# Patient Record
Sex: Female | Born: 1980 | Race: White | Hispanic: No | Marital: Single | Smoking: Former smoker
Health system: Southern US, Community
[De-identification: ages and names within clinical notes are randomized; demographics above are authoritative.]

## PROBLEM LIST (undated history)

## (undated) ENCOUNTER — Inpatient Hospital Stay (HOSPITAL_COMMUNITY): Payer: Self-pay

## (undated) DIAGNOSIS — O24419 Gestational diabetes mellitus in pregnancy, unspecified control: Secondary | ICD-10-CM

## (undated) DIAGNOSIS — E7800 Pure hypercholesterolemia, unspecified: Secondary | ICD-10-CM

## (undated) DIAGNOSIS — F419 Anxiety disorder, unspecified: Secondary | ICD-10-CM

## (undated) HISTORY — PX: DILATION AND CURETTAGE OF UTERUS: SHX78

## (undated) HISTORY — PX: KNEE SURGERY: SHX244

---
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
## 2001-08-04 ENCOUNTER — Emergency Department (HOSPITAL_COMMUNITY): Admission: EM | Admit: 2001-08-04 | Discharge: 2001-08-04 | Payer: Self-pay | Admitting: Emergency Medicine

## 2001-08-04 ENCOUNTER — Encounter: Payer: Self-pay | Admitting: Emergency Medicine

## 2002-06-13 ENCOUNTER — Other Ambulatory Visit: Admission: RE | Admit: 2002-06-13 | Discharge: 2002-06-13 | Payer: Self-pay | Admitting: *Deleted

## 2002-11-18 ENCOUNTER — Encounter: Admission: RE | Admit: 2002-11-18 | Discharge: 2002-11-18 | Payer: Self-pay | Admitting: *Deleted

## 2003-01-02 ENCOUNTER — Inpatient Hospital Stay (HOSPITAL_COMMUNITY): Admission: AD | Admit: 2003-01-02 | Discharge: 2003-01-05 | Payer: Self-pay | Admitting: *Deleted

## 2003-02-13 ENCOUNTER — Other Ambulatory Visit: Admission: RE | Admit: 2003-02-13 | Discharge: 2003-02-13 | Payer: Self-pay | Admitting: *Deleted

## 2004-03-24 ENCOUNTER — Other Ambulatory Visit: Admission: RE | Admit: 2004-03-24 | Discharge: 2004-03-24 | Payer: Self-pay | Admitting: *Deleted

## 2004-09-19 ENCOUNTER — Ambulatory Visit (HOSPITAL_COMMUNITY): Admission: RE | Admit: 2004-09-19 | Discharge: 2004-09-19 | Payer: Self-pay | Admitting: Obstetrics and Gynecology

## 2005-08-30 ENCOUNTER — Other Ambulatory Visit: Admission: RE | Admit: 2005-08-30 | Discharge: 2005-08-30 | Payer: Self-pay | Admitting: Obstetrics and Gynecology

## 2005-11-07 IMAGING — US US OB COMP LESS 14 WK
1 series · 18 of 28 positions shown · non-contrast
Comparison: none

CLINICAL DATA: Unsure LMP with left lower quadrant pain.  Evaluate for ectopic pregnancy.  
EARLY OBSTETRICAL ULTRASOUND WITH TRANSVAGINAL: 
Multiple images of the uterus and adnexa were obtained using a transabdominal and endovaginal approaches. 
There is an intrauterine gestational sac which demonstrates a sac size correlating with a 5 week 0 day gestation.  A normal appearing yolk sac is seen.  No evidence for a fetal pole is seen, but would not be expected at today?s mean sac diameter of .5 cm.  Follow-up evaluation in one week can be undertaken for reassessment of appropriate progression of pregnancy if desired.  
There is a small old subchorionic hemorrhage identified.  No signs of a subacute or acute component to this subchorionic hemorrhage are noted. 
Both ovaries are seen and have a normal appearance with the left ovary measuring 2.3 x 2.8 x 2.0 cm and the right ovary measuring 2.4 x 2.4 x 1.5 cm.  No cul-de-sac or periovarian fluid is seen and no separate adnexal masses are noted.

[Series 1: us ob comp<14 wk · 18 of 53 slices shown]
[im 1/53]
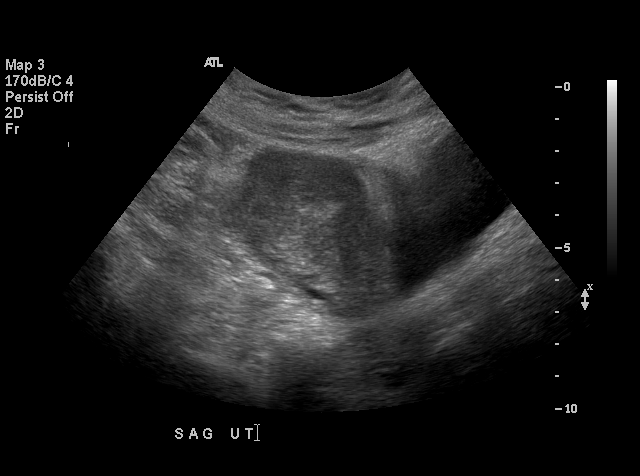
[im 4/53]
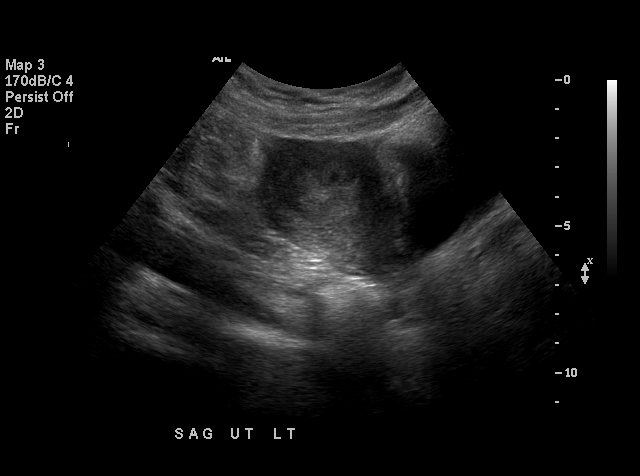
[im 6/53]
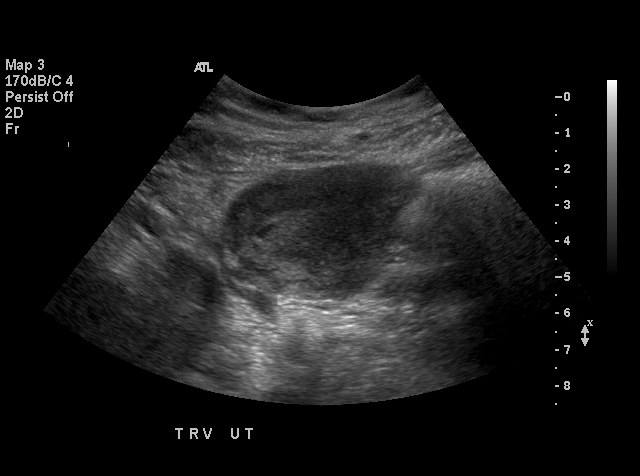
[im 10/53]
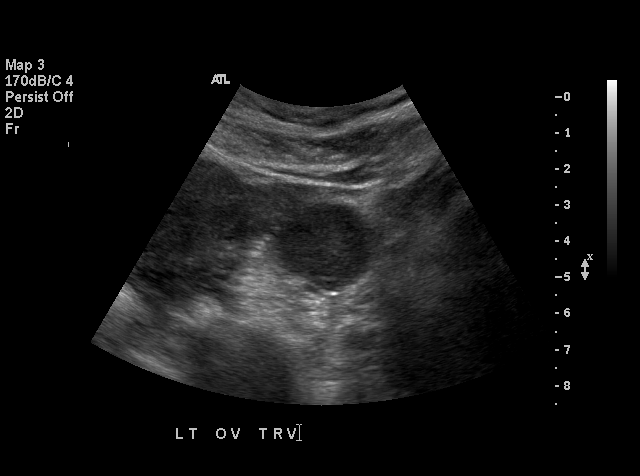
[im 14/53]
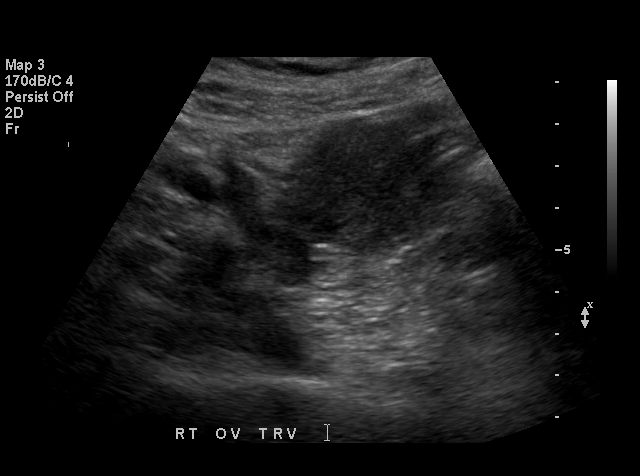
[im 16/53]
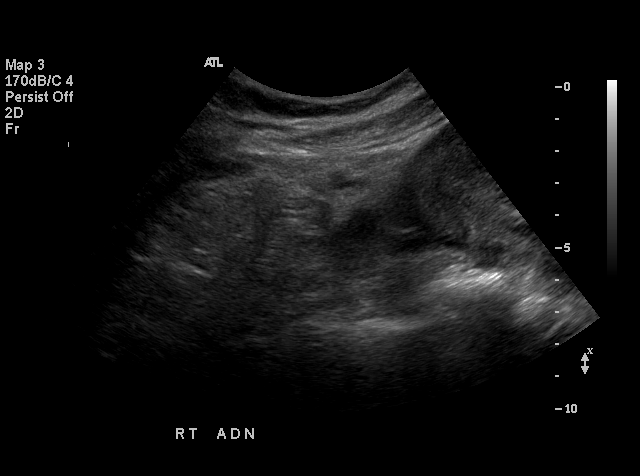
[im 20/53]
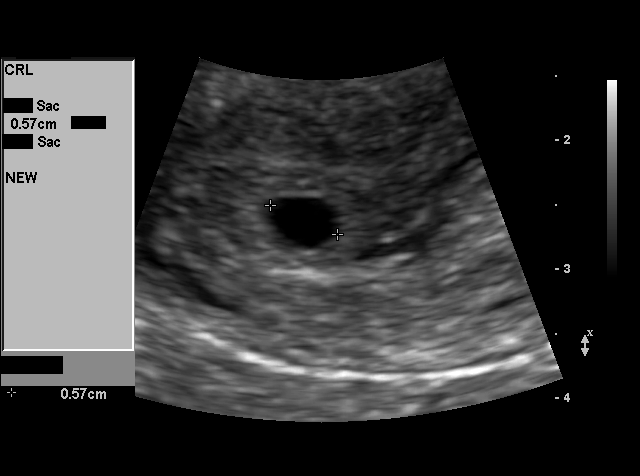
[im 22/53]
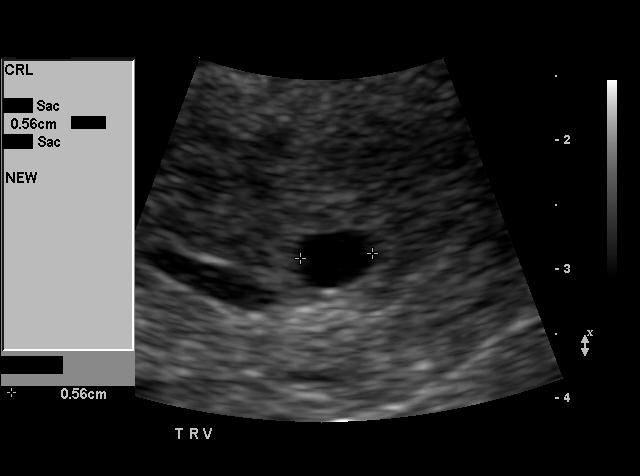
[im 26/53]
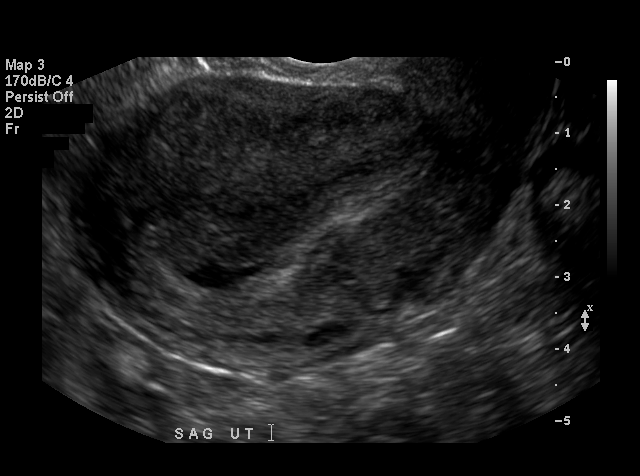
[im 27/53]
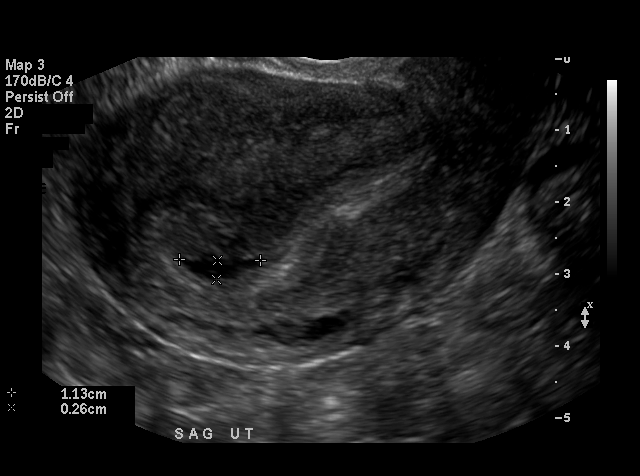
[im 31/53]
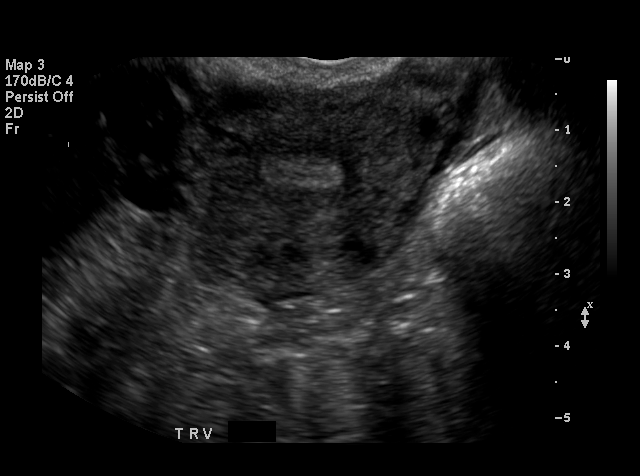
[im 33/53]
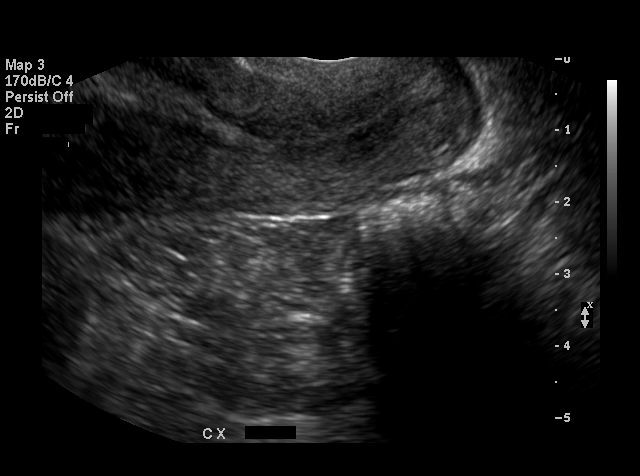
[im 37/53]
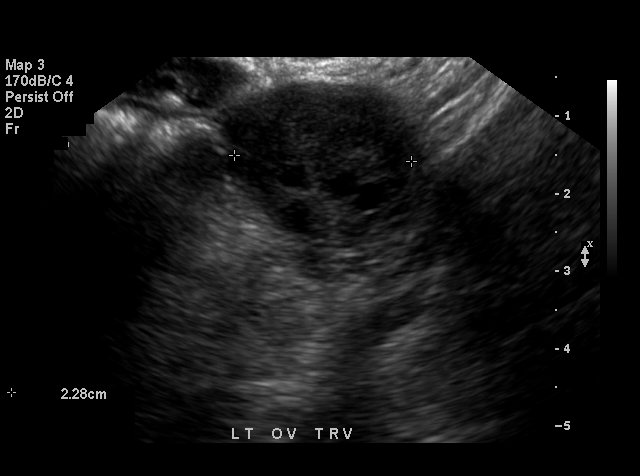
[im 41/53]
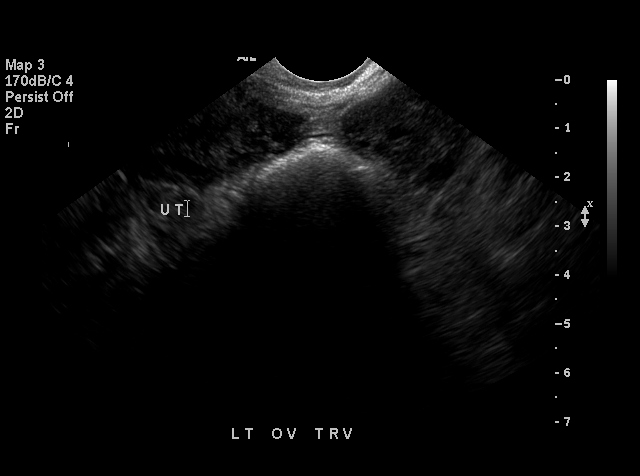
[im 43/53]
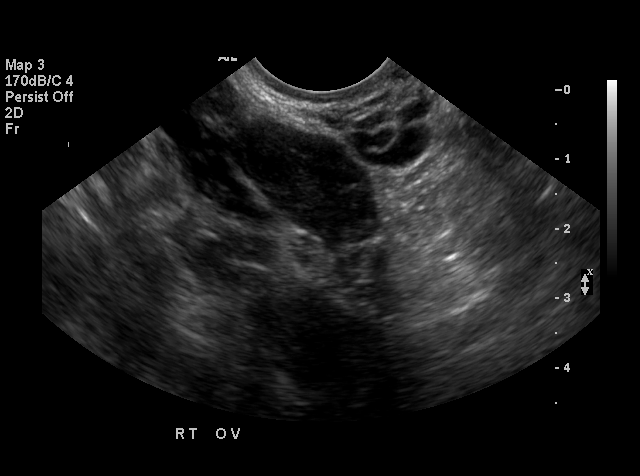
[im 47/53]
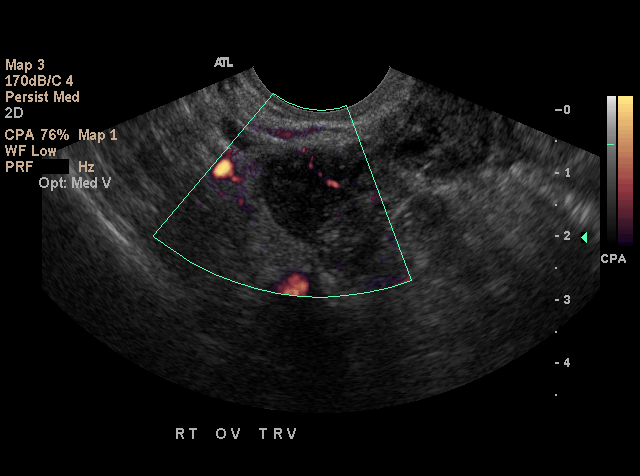
[im 49/53]
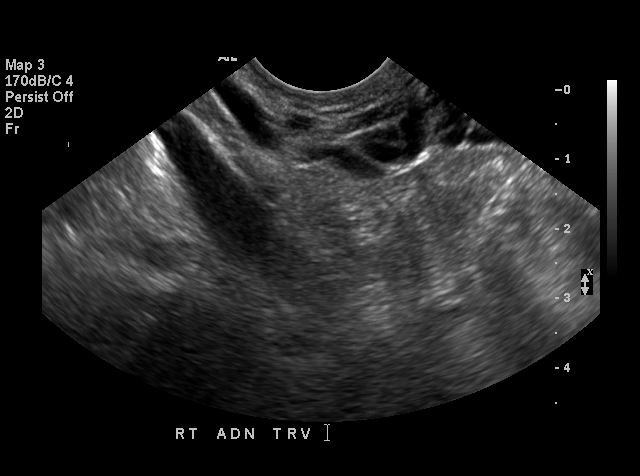
[im 53/53]
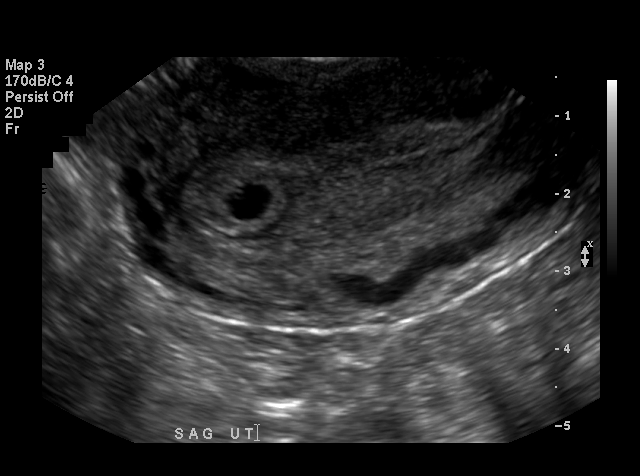

[18 of 28 positions shown; findings below may reference images not displayed]

IMPRESSION: 5 week 0 day intrauterine gestational sac with yolk sac.  
Small old subchorionic hemorrhage.  
Normal ovaries.  Please see above report for full discussion.

## 2006-01-05 ENCOUNTER — Inpatient Hospital Stay (HOSPITAL_COMMUNITY): Admission: AD | Admit: 2006-01-05 | Discharge: 2006-01-05 | Payer: Self-pay | Admitting: Obstetrics & Gynecology

## 2006-02-15 ENCOUNTER — Other Ambulatory Visit: Admission: RE | Admit: 2006-02-15 | Discharge: 2006-02-15 | Payer: Self-pay | Admitting: Obstetrics and Gynecology

## 2006-04-18 ENCOUNTER — Inpatient Hospital Stay (HOSPITAL_COMMUNITY): Admission: AD | Admit: 2006-04-18 | Discharge: 2006-04-18 | Payer: Self-pay | Admitting: Obstetrics and Gynecology

## 2006-06-26 ENCOUNTER — Encounter: Admission: RE | Admit: 2006-06-26 | Discharge: 2006-06-26 | Payer: Self-pay | Admitting: Obstetrics and Gynecology

## 2006-08-15 ENCOUNTER — Inpatient Hospital Stay (HOSPITAL_COMMUNITY): Admission: AD | Admit: 2006-08-15 | Discharge: 2006-08-15 | Payer: Self-pay | Admitting: Obstetrics and Gynecology

## 2006-08-24 ENCOUNTER — Inpatient Hospital Stay (HOSPITAL_COMMUNITY): Admission: AD | Admit: 2006-08-24 | Discharge: 2006-08-24 | Payer: Self-pay | Admitting: Obstetrics and Gynecology

## 2006-09-05 ENCOUNTER — Inpatient Hospital Stay (HOSPITAL_COMMUNITY): Admission: RE | Admit: 2006-09-05 | Discharge: 2006-09-08 | Payer: Self-pay | Admitting: Obstetrics and Gynecology

## 2007-07-21 ENCOUNTER — Emergency Department (HOSPITAL_COMMUNITY): Admission: EM | Admit: 2007-07-21 | Discharge: 2007-07-22 | Payer: Self-pay | Admitting: Emergency Medicine

## 2008-05-28 ENCOUNTER — Inpatient Hospital Stay (HOSPITAL_COMMUNITY): Admission: AD | Admit: 2008-05-28 | Discharge: 2008-05-28 | Payer: Self-pay | Admitting: Obstetrics and Gynecology

## 2008-10-31 ENCOUNTER — Inpatient Hospital Stay (HOSPITAL_COMMUNITY): Admission: AD | Admit: 2008-10-31 | Discharge: 2008-10-31 | Payer: Self-pay | Admitting: Obstetrics and Gynecology

## 2008-12-26 ENCOUNTER — Emergency Department (HOSPITAL_COMMUNITY): Admission: EM | Admit: 2008-12-26 | Discharge: 2008-12-26 | Payer: Self-pay | Admitting: Emergency Medicine

## 2009-03-19 ENCOUNTER — Encounter (INDEPENDENT_AMBULATORY_CARE_PROVIDER_SITE_OTHER): Payer: Self-pay | Admitting: Obstetrics and Gynecology

## 2009-03-19 ENCOUNTER — Ambulatory Visit (HOSPITAL_COMMUNITY): Admission: RE | Admit: 2009-03-19 | Discharge: 2009-03-19 | Payer: Self-pay | Admitting: Obstetrics and Gynecology

## 2009-05-01 ENCOUNTER — Emergency Department (HOSPITAL_COMMUNITY): Admission: EM | Admit: 2009-05-01 | Discharge: 2009-05-01 | Payer: Self-pay | Admitting: Emergency Medicine

## 2009-12-19 IMAGING — US US PELVIS COMPLETE MODIFY
1 series · 14 of 25 positions shown · non-contrast
Comparison: none

CLINICAL DATA: Left pelvic pain.

TRANSABDOMINAL AND TRANSVAGINAL ULTRASOUND OF PELVIS
TECHNIQUE: Both transabdominal and transvaginal ultrasound
examinations of the pelvis were performed including evaluation of
the uterus, ovaries, adnexal regions, and pelvic cul-de-sac.

[Series 1: us pelvis complete modify · 0.30mm/px · 14 of 37 slices shown]
[im 1/37]
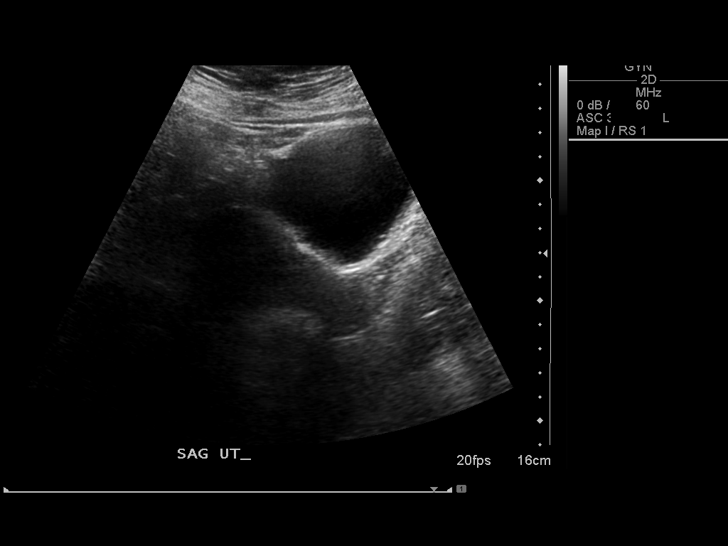
[im 4/37]
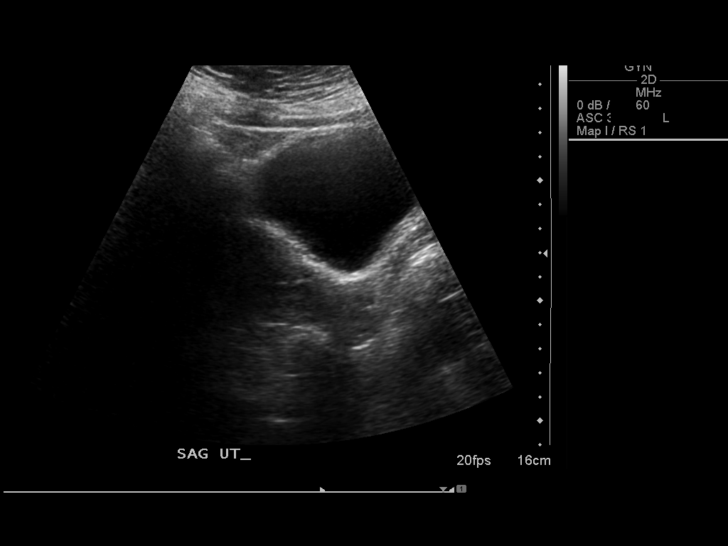
[im 7/37]
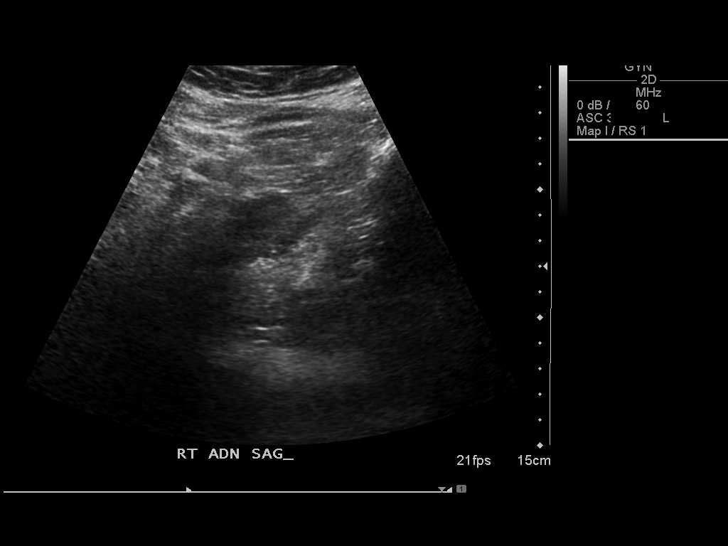
[im 10/37]
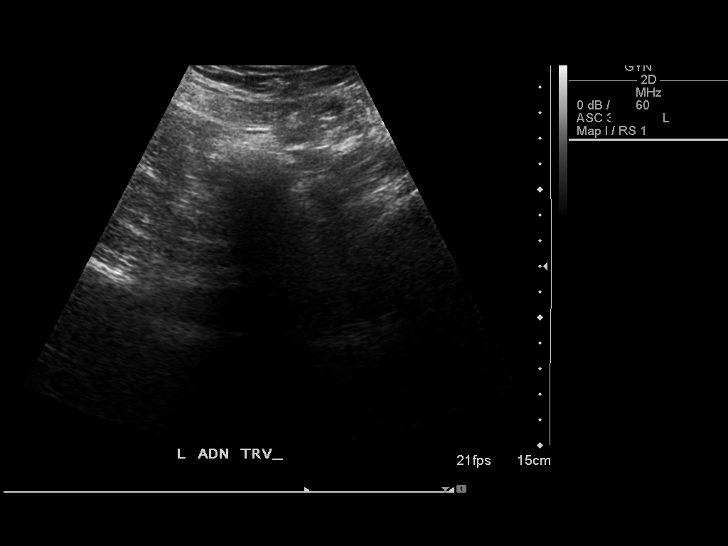
[im 13/37]
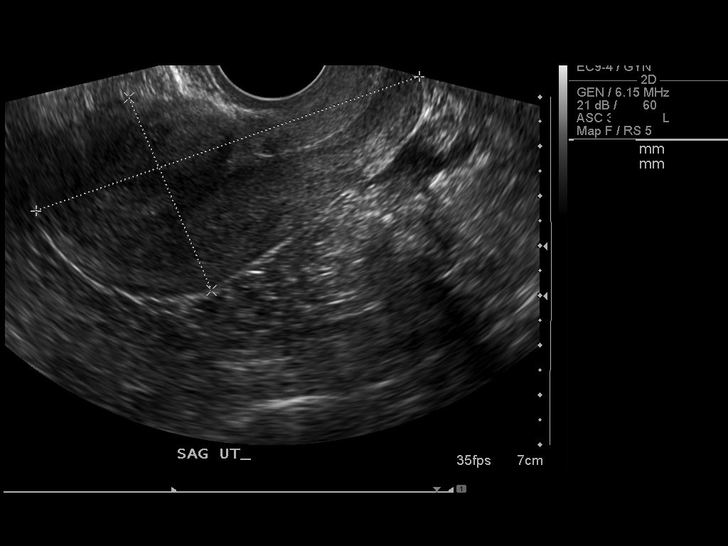
[im 14/37]
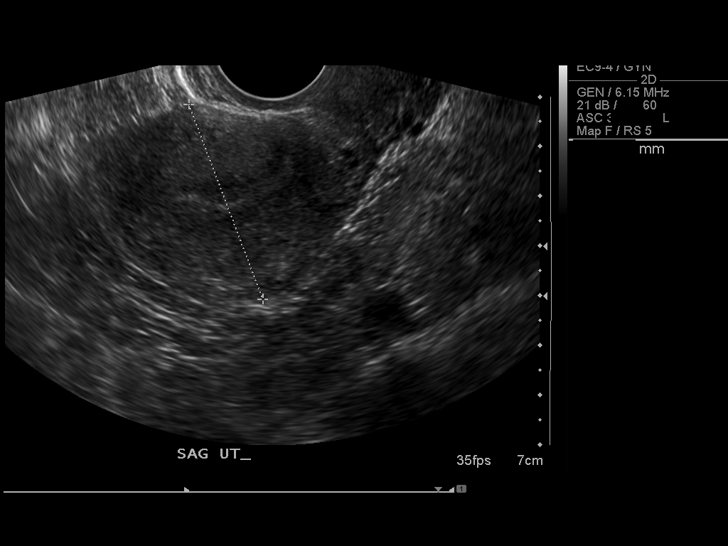
[im 17/37]
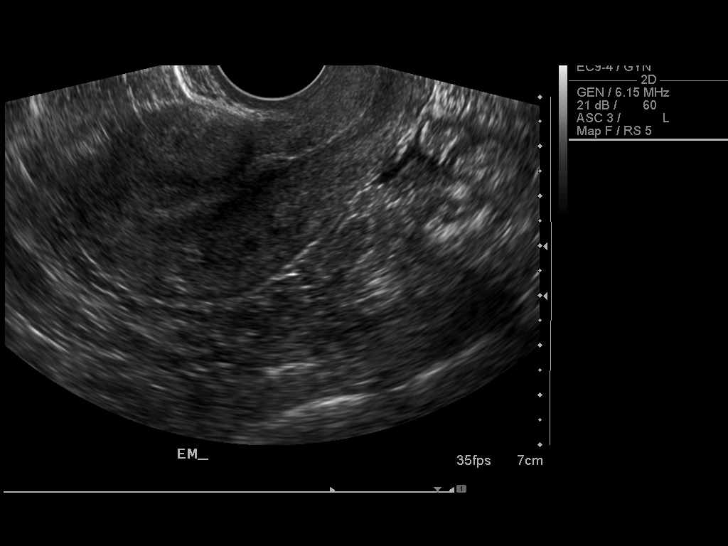
[im 20/37]
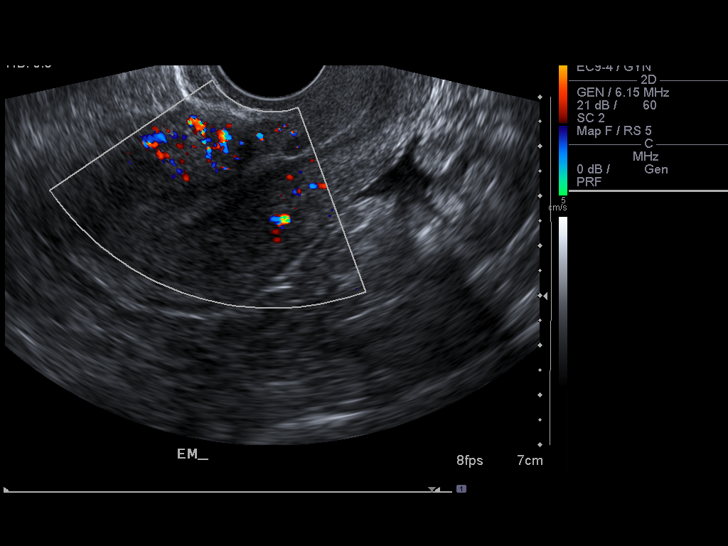
[im 23/37]
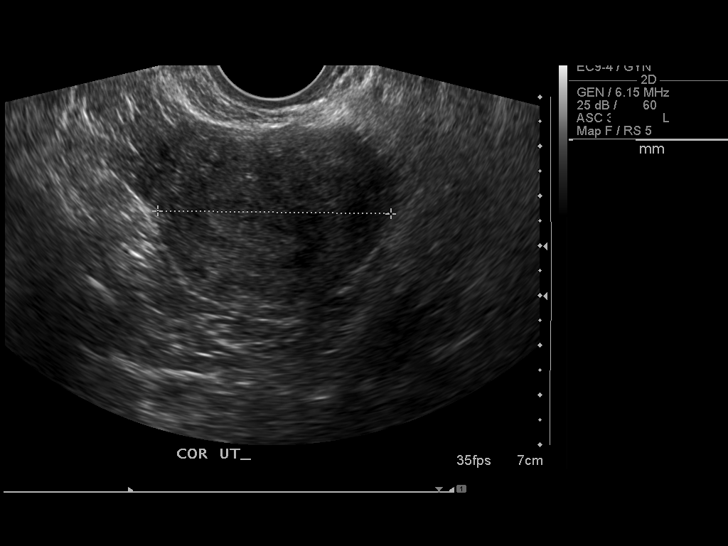
[im 25/37]
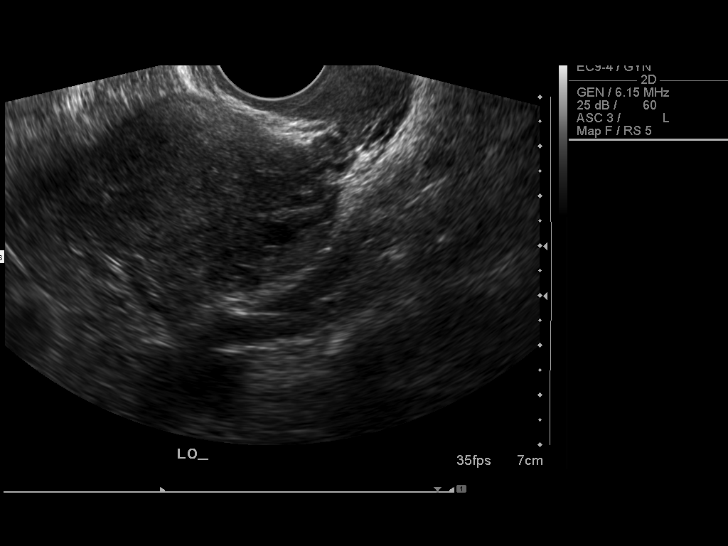
[im 28/37]
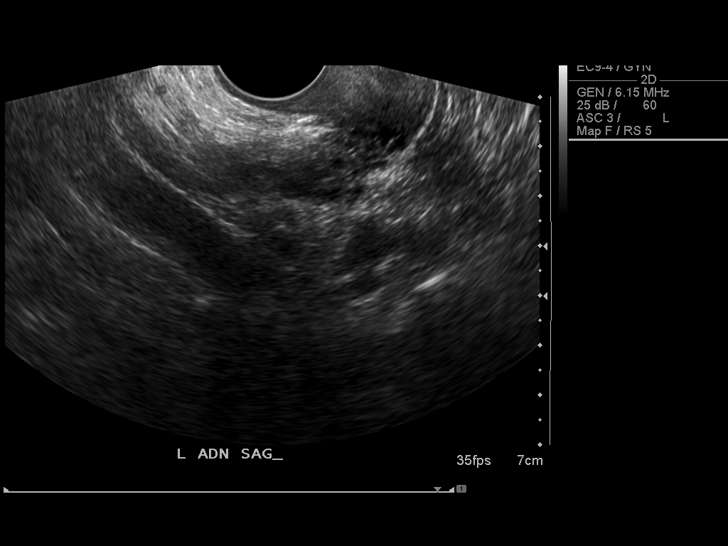
[im 31/37]
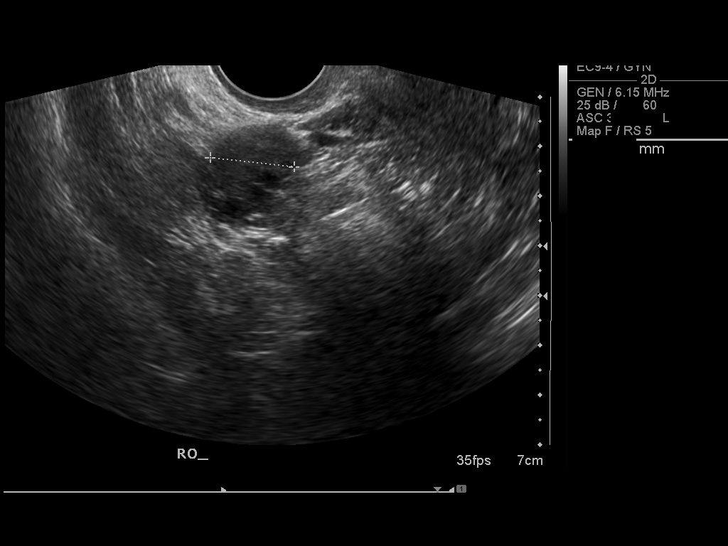
[im 34/37]
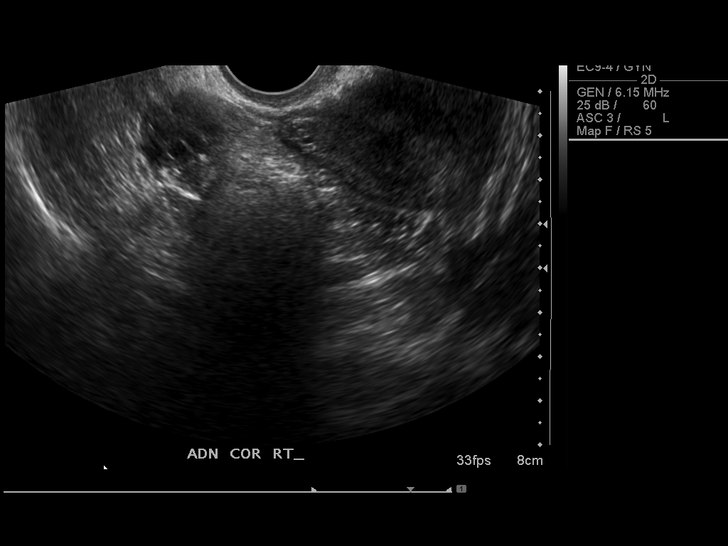
[im 37/37]
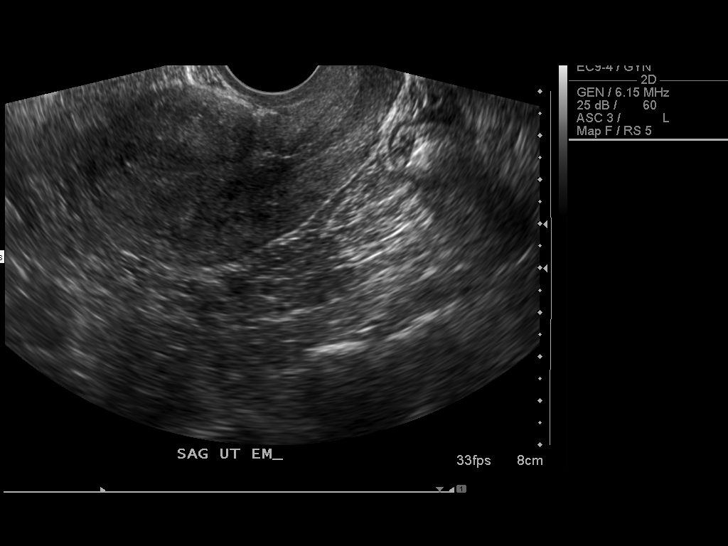

[14 of 25 positions shown; findings below may reference images not displayed]

FINDINGS: Normal appearing uterus with a normal appearing
endometrial stripe measuring 6.5 mm in maximum thickness
transvaginally. Normal appearing ovaries.  No free peritoneal
fluid.
IMPRESSION: Normal examination.

## 2011-01-15 ENCOUNTER — Encounter: Payer: Self-pay | Admitting: Obstetrics and Gynecology

## 2011-04-04 LAB — DIFFERENTIAL
Eosinophils Absolute: 0.1 10*3/uL (ref 0.0–0.7)
Eosinophils Relative: 2 % (ref 0–5)
Lymphs Abs: 2 10*3/uL (ref 0.7–4.0)
Monocytes Relative: 5 % (ref 3–12)

## 2011-04-04 LAB — COMPREHENSIVE METABOLIC PANEL
ALT: 17 U/L (ref 0–35)
AST: 31 U/L (ref 0–37)
Calcium: 8.9 mg/dL (ref 8.4–10.5)
GFR calc Af Amer: >60 mL/min (ref >60)
Sodium: 139 mEq/L (ref 135–145)
Total Protein: 7 g/dL (ref 6.0–8.3)

## 2011-04-04 LAB — URINALYSIS, ROUTINE W REFLEX MICROSCOPIC
Glucose, UA: NEGATIVE mg/dL
Hgb urine dipstick: NEGATIVE
Ketones, ur: NEGATIVE mg/dL
Protein, ur: NEGATIVE mg/dL
pH: 8 (ref 5.0–8.0)

## 2011-04-04 LAB — RPR: RPR Ser Ql: NONREACTIVE

## 2011-04-04 LAB — CBC
MCHC: 34.1 g/dL (ref 30.0–36.0)
Platelets: 222 10*3/uL (ref 150–400)
RDW: 14.1 % (ref 11.5–15.5)

## 2011-04-04 LAB — WET PREP, GENITAL
Trich, Wet Prep: NONE SEEN
Yeast Wet Prep HPF POC: NONE SEEN

## 2011-04-04 LAB — GC/CHLAMYDIA PROBE AMP, GENITAL: GC Probe Amp, Genital: NEGATIVE

## 2011-04-06 LAB — CBC
MCV: 90.8 fL (ref 78.0–100.0)
Platelets: 201 10*3/uL (ref 150–400)
RDW: 14.1 % (ref 11.5–15.5)
WBC: 8.4 10*3/uL (ref 4.0–10.5)

## 2011-05-09 NOTE — Op Note (Signed)
NAME:  Jamie [name], Jamie [name]                 ACCOUNT NO.:  [id number]   MEDICAL RECORD NO.:  [id number]          PATIENT TYPE:  AMB   LOCATION:  SDC                           FACILITY:  [facility]   PHYSICIAN:  Dineen Kid. Rana Snare, M.D.    DATE OF BIRTH:  January 10, 1981   DATE OF PROCEDURE:  03/19/2009  DATE OF DISCHARGE:                               OPERATIVE REPORT   PREOPERATIVE DIAGNOSIS:  Embryonic demise at 7-1/2 weeks' gestational  age.   POSTOPERATIVE DIAGNOSIS:  Embryonic demise at 7-1/2 weeks' gestational  age.   PROCEDURE:  Dilation and evacuation.   SURGEON:  Dineen Kid. Rana Snare, MD   ANESTHESIA:  General by LMA.   INDICATIONS:  [name] is a 30 year old followed by Dr. Marcelle Overlie,  presented to the office for ultrasound evaluation showing embryonic  demise.  She was scheduled for D and E yesterday, but because of having  food before she came in, it was rescheduled for today.  The risks and  benefits of the procedure were discussed at length and informed consent  was obtained.  She is Rh positive.   DESCRIPTION OF PROCEDURE:  After adequate analgesia, the patient was  placed in the dorsal lithotomy position.  She was sterilely prepped and  draped.  Bladder was sterilely drained.  Graves speculum was placed.  Tenaculum placed in the anterior lip of the cervix.  Uterus was sounded  to 10 cm, easily dilated with #29 Shawnie Pons dilator.  The 8-mm suction  curette was inserted.  Products of conception retrieved.  Curettage  performed to a gritty surface and felt throughout the endometrial  cavity.  No residual products felt.  The patient received 0.2 mg of  Methergine IM with good uterine response.  The curette was removed.  A  small laceration of the anterior cervix was repaired with a single 0-  Vicryl interrupted figure-of-eight with good approximation, good  hemostasis noted.  The patient tolerated procedure well, was stable on  transfer to recovery room.  Sponge, needle, and instrument count  was  normal x3.  Estimated blood loss was minimal.   DISPOSITION:  The patient will be discharged home.  Will follow up in  the office in 2-3 weeks and with a routine instruction sheet for D&C.  Told to return for increased pain, fever, or bleeding.  Also sent a  prescription for Methergine 0.2 mg to take 3 times a day for 2 days,  doxycycline 100 mg p.o. b.i.d. x7 days.      Dineen Kid Rana Snare, M.D.  Electronically Signed     DCL/MEDQ  D:  03/19/2009  T:  03/19/2009  Job:  [id number]

## 2011-05-12 NOTE — Discharge Summary (Signed)
NAME:  Jamie [name], Jamie [name]                 ACCOUNT NO.:  [id number]   MEDICAL RECORD NO.:  [id number]          PATIENT TYPE:  INP   LOCATION:  9146                          FACILITY:  [facility]   PHYSICIAN:  Guy Sandifer. Henderson Cloud, M.D. DATE OF BIRTH:  1981/06/01   DATE OF ADMISSION:  09/05/2006  DATE OF DISCHARGE:  09/08/2006                                 DISCHARGE SUMMARY   ADMITTING DIAGNOSES:  1. Intrauterine pregnancy at 38-1/2 weeks estimated gestational age.  2. Previous cesarean section, desires repeat.  3. Gestational diabetes.   DISCHARGE DIAGNOSIS:  Status post low transverse cesarean section to a  viable female infant.   PROCEDURE:  Repeat low transverse cesarean section.   REASON FOR ADMISSION:  Please see written H&P.   HOSPITAL COURSE:  The patient is a 30 year old gravida 4, para 1 who was  admitted to [facility] at 38-1/2 weeks estimated gestational  age for scheduled cesarean section.  The patient had had a previous cesarean  delivery and desired repeat.  Pregnancy had been complicated by gestational  diabetes which was managed by glimepiride.  Sugars had been within normal  limits.  On admission, the patient was taken to the operating room where  spinal anesthesia was administered without difficulty.  A low transverse  incision was made with delivery of a viable female infant weighing 8 pounds  2 ounces.  Apgar's at 9 at one minute and 9 at five minutes.  The patient  tolerated this procedure well and was taken to the recovery room in stable  condition.   Postoperative day #1, the patient was without complaint.  Vital signs were  stable.  She was afebrile.  Abdomen soft with good return of bowel function.  Fundus is firm and nontender.  Abdominal dressing had to be clean, dry and  intact.  Laboratory findings revealed hemoglobin of 10.1, platelet count  150,000, WBC 10.4.  Fasting blood sugar and two hour postprandial were  ordered for the a.m.   On  postoperative day #2, the patient was without complaint.  Vital signs  were stable.  She was afebrile.  Abdomen soft.  Fundus firm and nontender.  Abdominal dressing had been removed revealing incision that was clean, dry  and intact.  The patient was ambulating well.  Fasting blood sugar had been  97.   On postoperative day #3, the patient was without complaint.  Vital signs  were stable.  Fundus firm and nontender.  Incision was clean, dry and  intact.  Staples removed and the patient was later discharged home.   CONDITION ON DISCHARGE:  Good.   DIET:  Regular as tolerated.   ACTIVITY:  No heavy lifting, no driving x2 weeks, no vaginal entry.   FOLLOW-UP:  The patient is to follow up in the office in 1-2 weeks for an  incision check.  She is to call for temperature greater than 100 degrees,  persistent nausea and vomiting, heavy vaginal bleeding and/or redness or  drainage to the incisional site.   DISCHARGE MEDICATIONS:  1. Percocet 5/325, #30, one p.o. every 4-6 hours  p.r.n.  2. Motrin 600 mg every 6 hours.  3. Prenatal vitamins one p.o. daily.  4. Colace 1 p.o. daily.      Julio Sicks, N.P.      Guy Sandifer. Henderson Cloud, M.D.  Electronically Signed    CC/MEDQ  D:  10/09/2006  T:  10/10/2006  Job:  [id number]

## 2011-05-12 NOTE — Op Note (Signed)
NAME:  Jamie [name]                           ACCOUNT NO.:  [id number]   MEDICAL RECORD NO.:  [id number]                   PATIENT TYPE:  INP   LOCATION:  9132                                 FACILITY:  [facility]   PHYSICIAN:  Tracie Harrier, M.D.              DATE OF BIRTH:  May 22, 1981   DATE OF PROCEDURE:  01/02/2003  DATE OF DISCHARGE:                                 OPERATIVE REPORT   PREOPERATIVE DIAGNOSES:  1. Intrauterine pregnancy at 39 weeks.  2. Gestational diabetes.  3. Macrosomia.   POSTOPERATIVE DIAGNOSES:  1. Intrauterine pregnancy at 39 weeks.  2. Gestational diabetes.  3. Macrosomia.   PROCEDURE:  Primary low transverse cesarean section.   SURGEON:  Tracie Harrier, M.D.   ASSISTANT:  Duke Salvia. Marcelle Overlie, M.D.   ANESTHESIA:  Spinal.   ESTIMATED BLOOD LOSS:  800 cubic centimeters.   COMPLICATIONS:  None.   FINDINGS:  At 72 through a low transverse uterine incision, a viable  female infant was delivered from the vertex presentation.  The baby was a  female weighing 8 pounds 5 ounces.  Apgars were 9 and 9.  The baby did well.   The pelvis was visualized at time of surgery and noted to be normal.   DESCRIPTION OF PROCEDURE:  The patient was taken to the operating room,  where a spinal anesthetic was administered.  The patient was then placed on  the operating table in the left lateral tilt position.  The abdomen was  prepped and draped in the usual sterile fashion with Betadine and sterile  drapes.  A Foley catheter was inserted.  The abdomen was then entered  through a Pfannenstiel incision and carried down sharply in the usual  fashion.  The peritoneum was atraumatically entered.  The vesicouterine  peritoneum overlying the lower uterine segment was incised and a bladder  flap was bluntly and sharply created over the lower uterine segment.  A  bladder blade was then placed behind the bladder to ensure its protection  during the procedure.  The uterus  was then entered through a low transverse  incision and carried out laterally using the operator's fingers.  The  membranes were entered with clear fluid noted.  The vertex was noted into  the incision and with the assistance of a vacuum extraction device delivered  promptly and easily at 0744.  The oropharynx and nasopharynx were thoroughly  bulb-suctioned and the cord doubly clamped and cut.  The baby handed  promptly to the pediatricians.  The baby was born at 25.  The baby was a  female weighing 8 pounds 5 ounces.  The baby did well.  Apgars were 9 and 9.   The placenta was then manually extracted intact with three-vessel cord  without difficulty.  The anterior of the uterus was wiped clean with a wet  sponge thoroughly.  The uterine incision was then closed  in a two-layer  fashion, the first layer a running interlocking suture of #1 Vicryl.  A  second imbricating suture was placed across the primary suture line with a  running suture of #1 Vicryl as well.  The pelvis was then thoroughly  irrigated and noted to be hemostatic.  The rectus muscle and anterior  peritoneum were then closed with a running suture of #1 Vicryl.  The  subfascial layers were hemostatic.  The fascia was then closed with two  sutures of #1 Vicryl.  This was done by placing two sutures at the periphery  of the incision and with two running stitches meeting in the midline.  The  subcutaneous tissue was irrigated and made hemostatic using the Bovie  cautery.  The skin reapproximated with staples and a sterile dressing  applied.   Final sponge, needle, and instrument count was correct x3.  The patient did  receive an IV antibiotic after cord clamp.  There were no perioperative  complications.                                               Tracie Harrier, M.D.    REG/MEDQ  D:  01/05/2003  T:  01/05/2003  Job:  [id number]

## 2011-05-12 NOTE — Op Note (Signed)
NAME:  Jamie [name], Jamie [name]                 ACCOUNT NO.:  [id number]   MEDICAL RECORD NO.:  [id number]          PATIENT TYPE:  INP   LOCATION:  9146                          FACILITY:  [facility]   PHYSICIAN:  Michelle L. Grewal, M.D.DATE OF BIRTH:  June 04, 1981   DATE OF PROCEDURE:  09/05/2006  DATE OF DISCHARGE:                                 OPERATIVE REPORT   PREOP DIAGNOSIS:  1. Intrauterine pregnancy at term.  2. Previous C-section.  3. Gestational diabetes.   POSTOP DIAGNOSIS:  1. Intrauterine pregnancy at term.  2. Previous C-section.  3. Gestational diabetes.   PROCEDURE:  Repeat low transverse cesarean section.   SURGEON:  Michelle L. Vincente Poli, M.D.   ANESTHESIA:  Spinal.   SPECIMENS:  Female infant, Apgars of 9 at one minute, 9 at five minutes.  Estimated blood loss 500 mL.   COMPLICATIONS:  None.   DESCRIPTION OF PROCEDURE:  Patient was taken to the operating room.  She was  given her spinal by Dr. Malen Gauze without incident.  She was then prepped and  draped in the usual sterile fashion.  A Foley catheter was inserted.  A low  transverse incision was made and carried down to the fascia.  The fascia was  scored in the midline, extended laterally.  The rectus muscles were  separated in the midline, and the peritoneum was entered bluntly.  The  peritoneal incision was stretched.  The lower uterine segment was  identified.  The bladder flap was created sharply and then digitally.  The  bladder blade was readjusted.  A low transverse incision was made in the  uterus.  The uterus was entered using a hemostat.  The baby was in cephalic  presentation and delivered easily.  The baby was a female infant with Apgars  of 9 at one minute, 9 at five minutes.   The cord was clamped and cut.  The placenta was manually removed, noted to  be normal and intact with a 3-vessel cord.  The uterus was exteriorized and  cleared of all clots and debris.  It was closed in one layer using #0  chromic in  continuous running lock stitch.  It was hemostatic.  The uterus  was returned to the abdomen; irrigation performed.  The peritoneum was  closed using #0 Vicryl in a continuous running stitch.  The rectus muscles  were reapproximated  as well.  The fascia was closed using #0 Vicryl in a continuous running  stitch starting at each corner and meeting in the midline.  After irrigation  of the subcutaneous layer and noting hemostasis, the skin was closed with  staples.  All sponge, lap and instrument counts were correct x2.  The  patient went to recovery room in stable condition.      Michelle L. Vincente Poli, M.D.  Electronically Signed     MLG/MEDQ  D:  09/05/2006  T:  09/06/2006  Job:  [id number]

## 2011-05-12 NOTE — Discharge Summary (Signed)
NAME:  Jamie [name], Jamie [name]                           ACCOUNT NO.:  [id number]   MEDICAL RECORD NO.:  [id number]                   PATIENT TYPE:  INP   LOCATION:  9132                                 [facility]ACILITY:  [facility]   PHYSICIAN:  Guy Sandifer. Henderson Cloud, M.D.              DATE OF BIRTH:  May 16, 1981   DATE OF ADMISSION:  01/02/2003  DATE OF DISCHARGE:  01/05/2003                                 DISCHARGE SUMMARY   ADMISSION DIAGNOSES:  1. Intrauterine pregnancy at 49 weeks estimated gestational age.  2. Gestational diabetes.  3. Macrosomia.   DISCHARGE DIAGNOSES:  1. Status post low transverse cesarean section.  2. A viable female infant.   PROCEDURE:  Primary low transverse cesarean section.   REASON FOR ADMISSION:  Please see dictated H&P.   HOSPITAL COURSE:  The patient was a 30 year old gravida 2, para 1, that was  admitted at 41 weeks estimated gestational age.  The patient was admitted to  [facility] for cesarean section due to fetal macrosomia.  Pregnancy had been complicated by gestational diabetes which had been  controlled by diet.  Ultrasound revealed fetus with estimated fetal weight  of greater than 4000 grams.  Decision was made to proceed with cesarean  delivery.  On the a.m. of her surgery, patient was taken to the operating  room where spinal anesthesia was administered without difficulty.  A low  transverse incision was made with delivery of a viable female infant  weighing 8 pounds, 5 ounces, Apgar's were 9 at one minute, 9 at five  minutes.  The patient tolerated the procedure well and was taken to the  recovery room in stable condition.  On postoperative day one patient had  good return of bowel function, abdomen was soft, fundus was firm and  nontender, abdominal dressing was clean, dry and intact.  Labs revealed  hemoglobin of 11.8, platelet count 164,000, WBC count of 12,600.  On  postoperative day two patient was tolerating a regular diet without  complaints of nausea, vomiting, vital signs were stable, abdomen was soft  and nontender, abdominal dressing was removed revealing the incision to be  clean, dry and intact.  On postoperative day three patient was doing well,  she was ambulating without assistance, vital signs were stable and afebrile.  Abdomen remained soft, fundus was firm and nontender, incision was clean,  dry and intact with slight erythema noticed at the incisional site.  Staples  were removed and the patient was discharged home.  Prior to discharge  patient was given rubella vaccine for equivocal rubella status during her  pregnancy.   CONDITION ON DISCHARGE:  Good.   DIET:  Regular as tolerated.   ACTIVITY:  No heavy lifting, no driving x2 weeks.  No vaginal entry.   FOLLOW UP:  The patient is to follow up in the office in one to two weeks  for an incision check.  She is to call for temperature greater than 100.3,  persistent nausea and vomiting, heavy vaginal bleeding, any redness or  drainage at the incisional site.   DISCHARGE MEDICATIONS:  Percocet 5/325 #30 one p.o. q.4-6h p.r.n. pain,  Motrin 600 mg q.6h p.r.n., prenatal vitamins one p.o. daily, Colace one p.o.  daily p.r.n.      Julio Sicks, N.P.                        Guy Sandifer. Henderson Cloud, M.D.    CC/MEDQ  D:  03/30/2003  T:  03/30/2003  Job:  [id number]

## 2011-05-12 NOTE — H&P (Signed)
   NAME:  Jamie [name], Jamie [name]                           ACCOUNT NO.:  [id number]   MEDICAL RECORD NO.:  [id number]                   PATIENT TYPE:  INP   LOCATION:  9132                                 [facility]ILITY[facility]   PHYSICIAN:  Tracie Harrier, M.D.              DATE OF BIRTH:  Nov 29, 1981   DATE OF ADMISSION:  01/02/2003  DATE OF DISCHARGE:                                HISTORY & PHYSICAL   HISTORY OF PRESENT ILLNESS:  [name] is a 30 year old female, Gravida II,  Para 0, Ab 1 at [redacted] weeks gestation. The patient was admitted for primary  Cesarean section for fetal macrosomia. The patient developed gestational  diabetes during her pregnancy which was diet controlled. She had fairly good  control on diet but did have many high sugars because of dietary  incompliance. Ultrasound one week ago showed estimated fetal weight at 8  pounds 8 ounces. We discussed different approaches to delivery. After  considerable follow-up, we have decided to proceed with primary Cesarean  section for fetal macrosomia and gestational diabetes.   LABORATORY DATA:  Maternal blood type 0+. Rubella equivocal. Elevated three  hour glucose tolerance test. Group B strep negative.   PAST MEDICAL HISTORY:  None.   PAST SURGICAL HISTORY:  D&C times one.   OB HISTORY:  TAB times one.   CURRENT MEDICATIONS:  Prenatal vitamins.   ALLERGIES:  No known drug allergies.   SOCIAL HISTORY:  Positive for one half pack per day smoking.   PHYSICAL EXAMINATION:  VITAL SIGNS: Stable. Blood pressure 125/84,  temperature 97, fetal heart tones 140's.  GENERAL: A well developed, well nourished  female in no acute distress.  HEENT: Within normal limits.  NECK: Supple. No adenopathy.  HEART: Regular rate and rhythm. No murmur, rub, or gallop.  LUNGS: Clear to auscultation and percussion.  BREAST: Examination deferred.  ABDOMEN: Gravid and nontender.  EXTREMITIES: Grossly normal.  NEURO: Grossly normal.  PELVIC: Examination  deferred.   ADMISSION DIAGNOSES:  1. Intrauterine pregnancy at term.  2. Gestational diabetes.  3. Fetal increase in estimated fetal weight.   PLAN:  Primary Cesarean section.   DISCUSSION:  The risks and benefits of this surgery explained to the  patient. The risk of bleeding and infection reviewed with the patient.  Questions were answered regarding delivery mode. We discussed this at  length.                                               Tracie Harrier, M.D.    REG/MEDQ  D:  01/02/2003  T:  01/02/2003  Job:  [id number]

## 2011-05-12 NOTE — Op Note (Signed)
[facility]  Patient:    Jamie [name], Jamie [name]                        MRN: [id number] Proc. Date: 12/20/00 Adm. Date:  [id number] Attending:  Donne Hazel                           Operative Report  PREOPERATIVE DIAGNOSES:       1. Pelvic pain.                               2. Intrauterine process of unknown etiology.  POSTOPERATIVE DIAGNOSES:      1. Pelvic pain.                               2. Intrauterine process of unknown etiology.  PROCEDURE:                    Dilation and evacuation.  SURGEON:                      Willey Blade, M.D.  ANESTHESIA:                   MAC with local supplementation.  ESTIMATED BLOOD LOSS:         100 cc.  COMPLICATIONS:                None.  FINDINGS:                     At time of D&E, a large amount of dark-dark blood was aspirated from the intrauterine cavity. This was measured approximately 100 cc. Few, minimal cellular (solid) components were noted during the aspiration.  DESCRIPTION OF PROCEDURE:     The patient presented on admission and was taken to the operating room where MAC anesthetic was administered. The patient was placed on the operating table in the dorsal lithotomy position, a speculum was placed, the perineum and vagina were prepped and draped in the usual sterile fashion with Betadine and sterile drapes. The anterior lip of the cervix was grasped with a single-tooth tenaculum. Next, a paracervical block was placed in the 3 oclock and 9 oclock positions. Ten cc of 1% Xylocaine was used to infiltrate the paracervical area. This was done with a 22-gauge spinal needle. The cervix was then serially dilated until a #21 Pratt dilator could ease into the intracervical os. Next, using a small 6 suction curet, the uterus was suction curetted in the usual fashion. One hundred cc of dark blood was aspirated with few cellular elements noted with minimal solid components noted. Three passes were  made with the above noted findings. The uterus was then sharply curetted and noted to be empty. One final passage was then made with the suction curet. All vaginal instruments were removed. Patient was awakened and taken to the recovery room in good condition. There were no perioperative complications.DD:  12/20/00 TD:  12/20/00 Job: [id number]

## 2011-09-21 LAB — WET PREP, GENITAL
Clue Cells Wet Prep HPF POC: NONE SEEN
Trich, Wet Prep: NONE SEEN
Yeast Wet Prep HPF POC: NONE SEEN

## 2011-09-21 LAB — URINALYSIS, ROUTINE W REFLEX MICROSCOPIC
Ketones, ur: NEGATIVE
Nitrite: POSITIVE — AB
Protein, ur: 100 — AB
Urobilinogen, UA: 0.2
pH: 5

## 2011-09-21 LAB — GC/CHLAMYDIA PROBE AMP, GENITAL
Chlamydia, DNA Probe: NEGATIVE
GC Probe Amp, Genital: NEGATIVE

## 2011-09-21 LAB — URINE MICROSCOPIC-ADD ON

## 2011-09-21 LAB — POCT PREGNANCY, URINE: Operator id: 22333

## 2011-09-26 LAB — GC/CHLAMYDIA PROBE AMP, GENITAL: Chlamydia, DNA Probe: NEGATIVE

## 2011-09-26 LAB — URINALYSIS, ROUTINE W REFLEX MICROSCOPIC
Glucose, UA: NEGATIVE
Nitrite: NEGATIVE
pH: 7

## 2011-09-26 LAB — CBC
HCT: 39.9
Hemoglobin: 13.4
Platelets: 304
RDW: 13.5
WBC: 8.3

## 2011-09-26 LAB — POCT PREGNANCY, URINE: Preg Test, Ur: NEGATIVE

## 2012-05-27 ENCOUNTER — Inpatient Hospital Stay (HOSPITAL_COMMUNITY): Payer: 59

## 2012-05-27 ENCOUNTER — Inpatient Hospital Stay (HOSPITAL_COMMUNITY)
Admission: AD | Admit: 2012-05-27 | Discharge: 2012-05-27 | Disposition: A | Discharge status: Home / Self Care | Payer: 59 | Source: Ambulatory Visit | Attending: Obstetrics and Gynecology | Admitting: Obstetrics and Gynecology

## 2012-05-27 ENCOUNTER — Encounter (HOSPITAL_COMMUNITY): Payer: Self-pay | Admitting: *Deleted

## 2012-05-27 DIAGNOSIS — O26891 Other specified pregnancy related conditions, first trimester: Secondary | ICD-10-CM

## 2012-05-27 DIAGNOSIS — O99891 Other specified diseases and conditions complicating pregnancy: Secondary | ICD-10-CM | POA: Insufficient documentation

## 2012-05-27 DIAGNOSIS — O9989 Other specified diseases and conditions complicating pregnancy, childbirth and the puerperium: Secondary | ICD-10-CM

## 2012-05-27 DIAGNOSIS — R1032 Left lower quadrant pain: Secondary | ICD-10-CM

## 2012-05-27 DIAGNOSIS — O26899 Other specified pregnancy related conditions, unspecified trimester: Secondary | ICD-10-CM

## 2012-05-27 DIAGNOSIS — R109 Unspecified abdominal pain: Secondary | ICD-10-CM | POA: Insufficient documentation

## 2012-05-27 HISTORY — DX: Gestational diabetes mellitus in pregnancy, unspecified control: O24.419

## 2012-05-27 LAB — WET PREP, GENITAL: Trich, Wet Prep: NONE SEEN

## 2012-05-27 LAB — DIFFERENTIAL
Eosinophils Relative: 3 % (ref 0–5)
Lymphocytes Relative: 34 % (ref 12–46)
Lymphs Abs: 3 10*3/uL (ref 0.7–4.0)
Monocytes Relative: 6 % (ref 3–12)
Neutrophils Relative %: 57 % (ref 43–77)

## 2012-05-27 LAB — POCT PREGNANCY, URINE: Preg Test, Ur: POSITIVE — AB

## 2012-05-27 LAB — URINALYSIS, ROUTINE W REFLEX MICROSCOPIC
Glucose, UA: NEGATIVE mg/dL
Ketones, ur: NEGATIVE mg/dL
Leukocytes, UA: NEGATIVE
Nitrite: NEGATIVE
Protein, ur: NEGATIVE mg/dL
pH: 7 (ref 5.0–8.0)

## 2012-05-27 LAB — CBC
Hemoglobin: 11.5 g/dL — ABNORMAL LOW (ref 12.0–15.0)
MCV: 88.9 fL (ref 78.0–100.0)
Platelets: 262 10*3/uL (ref 150–400)
RBC: 3.89 MIL/uL (ref 3.87–5.11)
WBC: 9 10*3/uL (ref 4.0–10.5)

## 2012-05-27 LAB — HCG, QUANTITATIVE, PREGNANCY: hCG, Beta Chain, Quant, S: 1316 m[IU]/mL — ABNORMAL HIGH (ref <5)

## 2012-05-27 NOTE — MAU Provider Note (Signed)
History     CSN: [id number]  Arrival date & time 05/27/12  0018   None     Chief Complaint  Patient presents with  . Abdominal Pain    HPI Jamie [name] is a 31 y.o. female @ 108w0d gestation who presents to MAU for abdominal pain. The pain started approximately 3 pm while at work. Thought the pain would get better but has gotten worse. She describes the pain as sharp that comes and goes. The pain is in the left lower abdomen and radiates to the left upper leg. She rates the pain as 6/10. Denies nausea or vomiting. Frequent urination but no dysuria. Getting prenatal care with Dr. Georgette Shell in to confirm pregnancy. Ultrasound scheduled for 06/05/12. Last pap 2 years ago and was normal. No history of STI's. Current sex partner x 7 years.  The history was provided by the patient.  Past Medical History  Diagnosis Date  . Gestational diabetes     Past Surgical History  Procedure Date  . Cesarean section   . Dilation and curettage of uterus     History reviewed. No pertinent family history.  History  Substance Use Topics  . Smoking status: Former Games developer  . Smokeless tobacco: Not on file  . Alcohol Use: No    OB History    Grav Para Term Preterm Abortions TAB SAB Ect Mult Living   7    4 2 2   2       Review of Systems  Constitutional: Negative for fever, chills, diaphoresis and fatigue.  HENT: Negative for ear pain, congestion, sore throat, facial swelling, neck pain, neck stiffness, dental problem and sinus pressure.   Eyes: Negative for photophobia, pain, discharge and visual disturbance.  Respiratory: Negative for cough, chest tightness and wheezing.   Cardiovascular: Negative for chest pain and palpitations.  Gastrointestinal: Positive for abdominal pain. Negative for nausea, vomiting, diarrhea, constipation and abdominal distention.  Genitourinary: Positive for frequency and pelvic pain. Negative for dysuria, flank pain, vaginal bleeding, vaginal discharge, difficulty  urinating and dyspareunia.  Musculoskeletal: Negative for myalgias, back pain and gait problem.  Skin: Negative for color change and rash.  Neurological: Positive for headaches. Negative for dizziness, speech difficulty, weakness, light-headedness and numbness.  Psychiatric/Behavioral: Negative for confusion and agitation. The patient is not nervous/anxious.        Hx of anxiety    Allergies  Review of patient's allergies indicates no known allergies.  Home Medications  No current outpatient prescriptions on file.  BP 115/65  Pulse 86  Temp(Src) 98.8 F (37.1 C) (Oral)  Resp 18  Ht 5\' 3"  (1.6 m)  Wt 198 lb (89.812 kg)  BMI 35.07 kg/m2  SpO2 98%  LMP 04/15/2012  Physical Exam  Nursing note and vitals reviewed. Constitutional: She is oriented to person, place, and time. She appears well-developed and well-nourished.  HENT:  Head: Normocephalic.  Eyes: EOM are normal.  Neck: Neck supple.  Cardiovascular: Normal rate.   Pulmonary/Chest: Effort normal.  Abdominal: Soft. There is no tenderness.  Genitourinary:       External genitalia without lesions. White discharge vaginal vault. Cervix closed, long, no CMT, mildly tender left adnexa. Uterus slightly enlarged.  Musculoskeletal: Normal range of motion.  Neurological: She is alert and oriented to person, place, and time. No cranial nerve deficit.  Skin: Skin is warm and dry.  Psychiatric: She has a normal mood and affect. Her behavior is normal. Judgment and thought content normal.   Results  for orders placed during the hospital encounter of 05/27/12 (from the past 24 hour(s))  URINALYSIS, ROUTINE W REFLEX MICROSCOPIC     Status: Abnormal   Collection Time   05/27/12 12:30 AM      Component Value Range   Color, Urine YELLOW  YELLOW    APPearance HAZY (*) CLEAR    Specific Gravity, Urine 1.020  1.005 - 1.030    pH 7.0  5.0 - 8.0    Glucose, UA NEGATIVE  NEGATIVE (mg/dL)   Hgb urine dipstick NEGATIVE  NEGATIVE    Bilirubin  Urine NEGATIVE  NEGATIVE    Ketones, ur NEGATIVE  NEGATIVE (mg/dL)   Protein, ur NEGATIVE  NEGATIVE (mg/dL)   Urobilinogen, UA 0.2  0.0 - 1.0 (mg/dL)   Nitrite NEGATIVE  NEGATIVE    Leukocytes, UA NEGATIVE  NEGATIVE   POCT PREGNANCY, URINE     Status: Abnormal   Collection Time   05/27/12 12:39 AM      Component Value Range   Preg Test, Ur POSITIVE (*) NEGATIVE   CBC     Status: Abnormal   Collection Time   05/27/12  1:27 AM      Component Value Range   WBC 9.0  4.0 - 10.5 (K/uL)   RBC 3.89  3.87 - 5.11 (MIL/uL)   Hemoglobin 11.5 (*) 12.0 - 15.0 (g/dL)   HCT 16.1 (*) 09.6 - 46.0 (%)   MCV 88.9  78.0 - 100.0 (fL)   MCH 29.6  26.0 - 34.0 (pg)   MCHC 33.2  30.0 - 36.0 (g/dL)   RDW 04.5  40.9 - 81.1 (%)   Platelets 262  150 - 400 (K/uL)  DIFFERENTIAL     Status: Normal   Collection Time   05/27/12  1:27 AM      Component Value Range   Neutrophils Relative 57  43 - 77 (%)   Neutro Abs 5.1  1.7 - 7.7 (K/uL)   Lymphocytes Relative 34  12 - 46 (%)   Lymphs Abs 3.0  0.7 - 4.0 (K/uL)   Monocytes Relative 6  3 - 12 (%)   Monocytes Absolute 0.5  0.1 - 1.0 (K/uL)   Eosinophils Relative 3  0 - 5 (%)   Eosinophils Absolute 0.2  0.0 - 0.7 (K/uL)   Basophils Relative 0  0 - 1 (%)   Basophils Absolute 0.0  0.0 - 0.1 (K/uL)  HCG, QUANTITATIVE, PREGNANCY     Status: Abnormal   Collection Time   05/27/12  1:27 AM      Component Value Range   hCG, Beta Chain, Quant, S 1316 (*) <5 (mIU/mL)  WET PREP, GENITAL     Status: Abnormal   Collection Time   05/27/12  1:30 AM      Component Value Range   Yeast Wet Prep HPF POC NONE SEEN  NONE SEEN    Trich, Wet Prep NONE SEEN  NONE SEEN    Clue Cells Wet Prep HPF POC FEW (*) NONE SEEN    WBC, Wet Prep HPF POC FEW (*) NONE SEEN    US Ob Comp Less 14 Wks  05/27/2012  *RADIOLOGY REPORT*  Clinical Data: Pelvic pain.  OBSTETRIC <14 WK [place] AND TRANSVAGINAL OB US  Technique:  Both transabdominal and transvaginal ultrasound examinations were performed for complete  evaluation of the gestation as well as the maternal uterus, adnexal regions, and pelvic cul-de-sac.  Transvaginal technique was performed to assess early pregnancy.  Comparison:  None.  Intrauterine gestational sac:  Visualized/normal in shape. Yolk sac: None Embryo: None Cardiac Activity: None Heart Rate: N/A bpm  MSD: 3.7  mm  4    w 6    d CRL:    mm     w     d        [place] EDC: 01/28/2013  Maternal uterus/adnexae: No subchorionic hemorrhage. Normal right ovary. Corpus luteum cyst left ovary. Trace free pelvic fluid.  IMPRESSION: Intrauterine gestational sac estimated at 4 weeks and 6 days gestation.  No embryonic pole or yolk sac.  Original Report Authenticated By: P. Loralie Champagne, M.D.   US Ob Transvaginal  05/27/2012  *RADIOLOGY REPORT*  Clinical Data: Pelvic pain.  OBSTETRIC <14 WK [place] AND TRANSVAGINAL OB US  Technique:  Both transabdominal and transvaginal ultrasound examinations were performed for complete evaluation of the gestation as well as the maternal uterus, adnexal regions, and pelvic cul-de-sac.  Transvaginal technique was performed to assess early pregnancy.  Comparison:  None.  Intrauterine gestational sac:  Visualized/normal in shape. Yolk sac: None Embryo: None Cardiac Activity: None Heart Rate: N/A bpm  MSD: 3.7  mm  4    w 6    d CRL:    mm     w     d        [place] EDC: 01/28/2013  Maternal uterus/adnexae: No subchorionic hemorrhage. Normal right ovary. Corpus luteum cyst left ovary. Trace free pelvic fluid.  IMPRESSION: Intrauterine gestational sac estimated at 4 weeks and 6 days gestation.  No embryonic pole or yolk sac.  Original Report Authenticated By: P. Loralie Champagne, M.D.   Assessment: IUGS @ 4 weeks and 6 days, no YS or FP seen yet  Plan:  Follow up in 48 hours for repeat Bhcg   Ectopic precautions   Return immediatly for problems. ED Course  Procedures   MDM

## 2012-05-27 NOTE — MAU Note (Signed)
PT SAYS SHE WAS AT WORK - WORKS [employer]- AND SUDDENLY STARTED HAVING PAIN IN L SIDE  OF ABD - AND RADIATES DOWN LEG.      WENT TO DR OFFICE  5-28- CONFIRMED PREG.  NEXT APPOINTMENT  ON 6-12- FOR U/S.

## 2012-05-27 NOTE — MAU Note (Signed)
Pt reports since 3 pm today she has been having sharp in lower left abd and "shoots" down her left leg. 6 weeks preg . LMP 04/15/2012

## 2012-05-27 NOTE — Discharge Instructions (Signed)
Your pregnancy hormone level tonight is 1316. With a normal pregnancy the number should double every 2 days. Return to have the hormone level repeated in 2 days. Return sooner for any problems. No sex, no tampons, nothing in the vagina until follow up.  Abdominal Pain During Pregnancy Abdominal discomfort is common in pregnancy. Most of the time, it does not cause harm. There are many causes of abdominal pain. Some causes are more serious than others. Some of the causes of abdominal pain in pregnancy are easily diagnosed. Occasionally, the diagnosis takes time to understand. Other times, the cause is not determined. Abdominal pain can be a sign that something is very wrong with the pregnancy, or the pain may have nothing to do with the pregnancy at all. For this reason, always tell your caregiver if you have any abdominal discomfort. CAUSES Common and harmless causes of abdominal pain include:  Constipation.   Excess gas and bloating.   Round ligament pain. This is pain that is felt in the folds of the groin.   The position the baby or placenta is in.   Baby kicks.   Braxton-Hicks contractions. These are mild contractions that do not cause cervical dilation.  Serious causes of abdominal pain include:  Ectopic pregnancy. This happens when a fertilized egg implants outside of the uterus.   Miscarriage.   Preterm labor. This is when labor starts at less than 37 weeks of pregnancy.   Placental abruption. This is when the placenta partially or completely separates from the uterus.   Preeclampsia. This is often associated with high blood pressure and has been referred to as "toxemia in pregnancy."   Uterine or amniotic fluid infections.  Causes unrelated to pregnancy include:  Urinary tract infection.   Gallbladder stones or inflammation.   Hepatitis or other liver illness.   Intestinal problems, stomach flu, food poisoning, or ulcer.   Appendicitis.   Kidney (renal) stones.    Kidney infection (pylonephritis).  HOME CARE INSTRUCTIONS  For mild pain:  Do not have sexual intercourse or put anything in your vagina until your symptoms go away completely.   Get plenty of rest until your pain improves. If your pain does not improve in 1 hour, call your caregiver.   Drink clear fluids if you feel nauseous. Avoid solid food as long as you are uncomfortable or nauseous.   Only take medicine as directed by your caregiver.   Keep all follow-up appointments with your caregiver.  SEEK IMMEDIATE MEDICAL CARE IF:  You are bleeding, leaking fluid, or passing tissue from the vagina.   You have increasing pain or cramping.   You have persistent vomiting.   You have painful or bloody urination.   You have a fever.   You notice a decrease in your baby's movements.   You have extreme weakness or feel faint.   You have shortness of breath, with or without abdominal pain.   You develop a severe headache with abdominal pain.   You have abnormal vaginal discharge with abdominal pain.   You have persistent diarrhea.   You have abdominal pain that continues even after rest, or gets worse.  MAKE SURE YOU:   Understand these instructions.   Will watch your condition.   Will get help right away if you are not doing well or get worse.  Document Released: 12/11/2005 Document Revised: 11/30/2011 Document Reviewed: 07/07/2011 [facility] Patient Information 2012 [place], [place].

## 2012-07-04 ENCOUNTER — Other Ambulatory Visit: Payer: Self-pay | Admitting: Obstetrics and Gynecology

## 2012-11-20 ENCOUNTER — Encounter: Payer: Self-pay | Admitting: *Deleted

## 2012-11-20 ENCOUNTER — Encounter: Payer: 59 | Attending: Obstetrics and Gynecology | Admitting: *Deleted

## 2012-11-20 DIAGNOSIS — Z713 Dietary counseling and surveillance: Secondary | ICD-10-CM | POA: Insufficient documentation

## 2012-11-20 DIAGNOSIS — O99810 Abnormal glucose complicating pregnancy: Secondary | ICD-10-CM | POA: Insufficient documentation

## 2012-11-20 NOTE — Patient Instructions (Signed)
Goals:  Check glucose levels per MD as instructed  Follow Gestational Diabetes Diet as instructed  Call for follow-up as needed    

## 2012-11-20 NOTE — Progress Notes (Signed)
  Patient was seen on 11/20/2012 for Gestational Diabetes self-management class at the [facility]. The following learning objectives were met by the patient during this course:   States the definition of Gestational Diabetes  States why dietary management is important in controlling blood glucose  Describes the effects each nutrient has on blood glucose levels  Demonstrates ability to create a balanced meal plan  Demonstrates carbohydrate counting   States when to check blood glucose levels  Demonstrates proper blood glucose monitoring techniques  States the effect of stress and exercise on blood glucose levels  States the importance of limiting caffeine and abstaining from alcohol and smoking  Blood glucose monitor given:Blood glucose monitor given: Contour Next EZ Blood Glucose Kit Lot # [id number] Exp: 10/2013 Blood glucose reading: 146 mg/dl after meal  Patient instructed to monitor glucose levels: FBS: 60 - <90 2 hour: <120  *Patient received handouts:  Nutrition Diabetes and Pregnancy  Carbohydrate Counting List  Patient will be seen for follow-up as needed.

## 2012-12-16 ENCOUNTER — Inpatient Hospital Stay (HOSPITAL_COMMUNITY)
Admission: AD | Admit: 2012-12-16 | Discharge: 2012-12-16 | Disposition: A | Discharge status: Home / Self Care | Payer: 59 | Source: Ambulatory Visit | Attending: Obstetrics and Gynecology | Admitting: Obstetrics and Gynecology

## 2012-12-16 ENCOUNTER — Encounter (HOSPITAL_COMMUNITY): Payer: Self-pay | Admitting: *Deleted

## 2012-12-16 DIAGNOSIS — O288 Other abnormal findings on antenatal screening of mother: Secondary | ICD-10-CM

## 2012-12-16 DIAGNOSIS — O99891 Other specified diseases and conditions complicating pregnancy: Secondary | ICD-10-CM | POA: Insufficient documentation

## 2012-12-16 DIAGNOSIS — O289 Unspecified abnormal findings on antenatal screening of mother: Secondary | ICD-10-CM | POA: Insufficient documentation

## 2012-12-16 NOTE — OB Triage Note (Signed)
Discharge instructions given and discussed with patient. All questions answered. Pt. Discharged home.

## 2012-12-16 NOTE — Discharge Summary (Signed)
  pregnacy at 35 + spontaneous decel in office Reactive tracing here with no decels D/c home kick counts  To  Office tomorrow for nst

## 2012-12-25 DIAGNOSIS — O24919 Unspecified diabetes mellitus in pregnancy, unspecified trimester: Secondary | ICD-10-CM

## 2012-12-25 HISTORY — DX: Unspecified diabetes mellitus in pregnancy, unspecified trimester: O24.919

## 2013-01-04 ENCOUNTER — Encounter (HOSPITAL_COMMUNITY): Payer: Self-pay | Admitting: *Deleted

## 2013-01-04 ENCOUNTER — Inpatient Hospital Stay (HOSPITAL_COMMUNITY)
Admission: AD | Admit: 2013-01-04 | Discharge: 2013-01-04 | Disposition: A | Discharge status: Home / Self Care | Payer: 59 | Source: Ambulatory Visit | Attending: Obstetrics and Gynecology | Admitting: Obstetrics and Gynecology

## 2013-01-04 DIAGNOSIS — O479 False labor, unspecified: Secondary | ICD-10-CM

## 2013-01-04 DIAGNOSIS — O99891 Other specified diseases and conditions complicating pregnancy: Secondary | ICD-10-CM | POA: Insufficient documentation

## 2013-01-04 DIAGNOSIS — R109 Unspecified abdominal pain: Secondary | ICD-10-CM | POA: Insufficient documentation

## 2013-01-04 LAB — POCT FERN TEST: POCT Fern Test: NEGATIVE

## 2013-01-04 NOTE — MAU Note (Signed)
Pt states had 2 epsiodes of leaking a clear fluid in the last 2-3 hours-not waring a pad at present

## 2013-01-04 NOTE — MAU Provider Note (Signed)
HPI: Jamie [name] is a 32 y.o. year old [id number] female at [redacted]w[redacted]d weeks gestation who presents to MAU reporting two episodes of leaking fluid that woke her up from sleep. Not wearing a pad when she arrived in MAU. Also reports mild cramping. Denies VB. Good FM.   Pelvic: NEFG, Neg pool. Small amount of thin, white, odorless discharge.  FHR 150's, category I Toco, rare mild Dilation: Closed Effacement (%): Thick Station: -3 Presentation: Vertex Exam by:: V.Pierina Schuknecht,CNM\  Fern neg. Few sperm seen on fern slide.   Assessment:  Intact membranes  Plan: D/C home Labor precautions, FKCs. F/U w/ [facility] as scheduled  [place], [place] 01/04/2013 4:03 AM

## 2013-01-20 ENCOUNTER — Encounter (HOSPITAL_COMMUNITY): Payer: Self-pay

## 2013-01-20 ENCOUNTER — Encounter (HOSPITAL_COMMUNITY)
Admission: RE | Admit: 2013-01-20 | Discharge: 2013-01-20 | Disposition: A | Discharge status: Home / Self Care | Payer: 59 | Source: Ambulatory Visit | Attending: Obstetrics and Gynecology | Admitting: Obstetrics and Gynecology

## 2013-01-20 LAB — RPR: RPR Ser Ql: NONREACTIVE

## 2013-01-20 LAB — BASIC METABOLIC PANEL
Chloride: 98 mEq/L (ref 96–112)
GFR calc Af Amer: >90 mL/min (ref >90)
Potassium: 4 mEq/L (ref 3.5–5.1)

## 2013-01-20 LAB — SURGICAL PCR SCREEN: Staphylococcus aureus: NEGATIVE

## 2013-01-20 LAB — CBC
Platelets: 169 10*3/uL (ref 150–400)
RDW: 16.2 % — ABNORMAL HIGH (ref 11.5–15.5)
WBC: 8.5 10*3/uL (ref 4.0–10.5)

## 2013-01-20 NOTE — Patient Instructions (Signed)
Your procedure is scheduled on:01/21/13  Enter through the Main Entrance at :1130 Pick up desk phone and dial [phone] and inform us of your arrival.  Please call [phone] if you have any problems the morning of surgery.  Remember: Do not eat after midnight:tonight Clear liquids ok until 9am on TUESDAY   DO NOT wear jewelry, eye make-up, lipstick,body lotion, or dark fingernail polish. Do not shave for 48 hours prior to surgery.  If you are to be admitted after surgery, leave suitcase in car until your room has been assigned. Patients discharged on the day of surgery will not be allowed to drive home.

## 2013-01-21 ENCOUNTER — Inpatient Hospital Stay (HOSPITAL_COMMUNITY)
Admission: AD | Admit: 2013-01-21 | Discharge: 2013-01-23 | DRG: 766 | Disposition: A | Discharge status: Home / Self Care | Payer: 59 | Source: Ambulatory Visit | Attending: Obstetrics and Gynecology | Admitting: Obstetrics and Gynecology

## 2013-01-21 ENCOUNTER — Encounter (HOSPITAL_COMMUNITY): Payer: Self-pay | Admitting: Anesthesiology

## 2013-01-21 ENCOUNTER — Encounter (HOSPITAL_COMMUNITY): Payer: Self-pay

## 2013-01-21 ENCOUNTER — Encounter (HOSPITAL_COMMUNITY)
Admission: AD | Disposition: A | Discharge status: Home / Self Care | Payer: Self-pay | Source: Ambulatory Visit | Attending: Obstetrics and Gynecology

## 2013-01-21 ENCOUNTER — Inpatient Hospital Stay (HOSPITAL_COMMUNITY): Payer: 59 | Admitting: Anesthesiology

## 2013-01-21 DIAGNOSIS — O99814 Abnormal glucose complicating childbirth: Secondary | ICD-10-CM | POA: Diagnosis present

## 2013-01-21 DIAGNOSIS — Z302 Encounter for sterilization: Secondary | ICD-10-CM

## 2013-01-21 DIAGNOSIS — Z01818 Encounter for other preprocedural examination: Secondary | ICD-10-CM

## 2013-01-21 DIAGNOSIS — O34219 Maternal care for unspecified type scar from previous cesarean delivery: Principal | ICD-10-CM | POA: Diagnosis present

## 2013-01-21 DIAGNOSIS — Z01812 Encounter for preprocedural laboratory examination: Secondary | ICD-10-CM

## 2013-01-21 LAB — GLUCOSE, CAPILLARY: Glucose-Capillary: 73 mg/dL (ref 70–99)

## 2013-01-21 SURGERY — Surgical Case
Anesthesia: Spinal | Site: Abdomen | Laterality: Bilateral | Wound class: Clean Contaminated

## 2013-01-21 MED ORDER — LACTATED RINGERS IV SOLN
INTRAVENOUS | Status: DC | PRN
  Administered 2013-01-21 (×3): via INTRAVENOUS

## 2013-01-21 MED ORDER — LACTATED RINGERS IV SOLN
INTRAVENOUS | Status: DC
  Administered 2013-01-21: 12:00:00 via INTRAVENOUS

## 2013-01-21 MED ORDER — KETOROLAC TROMETHAMINE 30 MG/ML IJ SOLN
INTRAMUSCULAR | Status: AC
  Administered 2013-01-21: 30 mg via INTRAVENOUS
  Filled 2013-01-21: qty 1

## 2013-01-21 MED ORDER — ONDANSETRON HCL 4 MG/2ML IJ SOLN: 4.0000 mg | Freq: Three times a day (TID) | INTRAMUSCULAR | Status: DC | PRN

## 2013-01-21 MED ORDER — LACTATED RINGERS IV SOLN
INTRAVENOUS | Status: DC
  Administered 2013-01-22: 02:00:00 via INTRAVENOUS

## 2013-01-21 MED ORDER — TETANUS-DIPHTH-ACELL PERTUSSIS 5-2.5-18.5 LF-MCG/0.5 IM SUSP: 0.5000 mL | Freq: Once | INTRAMUSCULAR | Status: DC

## 2013-01-21 MED ORDER — ZOLPIDEM TARTRATE 5 MG PO TABS: 5.0000 mg | ORAL_TABLET | Freq: Every evening | ORAL | Status: DC | PRN

## 2013-01-21 MED ORDER — MENTHOL 3 MG MT LOZG: 1.0000 | LOZENGE | OROMUCOSAL | Status: DC | PRN

## 2013-01-21 MED ORDER — EPHEDRINE 5 MG/ML INJ
INTRAVENOUS | Status: AC
  Filled 2013-01-21: qty 10

## 2013-01-21 MED ORDER — IBUPROFEN 600 MG PO TABS
600.0000 mg | ORAL_TABLET | Freq: Four times a day (QID) | ORAL | Status: DC
  Administered 2013-01-21 – 2013-01-23 (×6): 600 mg via ORAL
  Filled 2013-01-21 (×6): qty 1

## 2013-01-21 MED ORDER — NALBUPHINE SYRINGE 5 MG/0.5 ML
INJECTION | INTRAMUSCULAR | Status: AC
  Administered 2013-01-21: 2.5 mg via INTRAVENOUS
  Filled 2013-01-21: qty 0.5

## 2013-01-21 MED ORDER — PRENATAL MULTIVITAMIN CH
1.0000 | ORAL_TABLET | Freq: Every day | ORAL | Status: DC
  Administered 2013-01-22 – 2013-01-23 (×2): 1 via ORAL
  Filled 2013-01-21 (×2): qty 1

## 2013-01-21 MED ORDER — DIPHENHYDRAMINE HCL 25 MG PO CAPS: 25.0000 mg | ORAL_CAPSULE | Freq: Four times a day (QID) | ORAL | Status: DC | PRN

## 2013-01-21 MED ORDER — PHENYLEPHRINE 40 MCG/ML (10ML) SYRINGE FOR IV PUSH (FOR BLOOD PRESSURE SUPPORT)
PREFILLED_SYRINGE | INTRAVENOUS | Status: AC
  Filled 2013-01-21: qty 5

## 2013-01-21 MED ORDER — PHENYLEPHRINE HCL 10 MG/ML IJ SOLN
INTRAMUSCULAR | Status: DC | PRN
  Administered 2013-01-21: 40 ug via INTRAVENOUS
  Administered 2013-01-21: 120 ug via INTRAVENOUS
  Administered 2013-01-21: 40 ug via INTRAVENOUS
  Administered 2013-01-21: 80 ug via INTRAVENOUS
  Administered 2013-01-21: 40 ug via INTRAVENOUS
  Administered 2013-01-21 (×3): 80 ug via INTRAVENOUS

## 2013-01-21 MED ORDER — CEFAZOLIN SODIUM-DEXTROSE 2-3 GM-% IV SOLR: 2.0000 g | INTRAVENOUS | Status: DC

## 2013-01-21 MED ORDER — OXYTOCIN 10 UNIT/ML IJ SOLN
INTRAMUSCULAR | Status: AC
  Filled 2013-01-21: qty 4

## 2013-01-21 MED ORDER — KETOROLAC TROMETHAMINE 30 MG/ML IJ SOLN
30.0000 mg | Freq: Four times a day (QID) | INTRAMUSCULAR | Status: AC | PRN
  Administered 2013-01-21: 30 mg via INTRAVENOUS

## 2013-01-21 MED ORDER — EPHEDRINE SULFATE 50 MG/ML IJ SOLN
INTRAMUSCULAR | Status: DC | PRN
  Administered 2013-01-21: 5 mg via INTRAVENOUS
  Administered 2013-01-21: 10 mg via INTRAVENOUS
  Administered 2013-01-21: 5 mg via INTRAVENOUS

## 2013-01-21 MED ORDER — WITCH HAZEL-GLYCERIN EX PADS: 1.0000 "application " | MEDICATED_PAD | CUTANEOUS | Status: DC | PRN

## 2013-01-21 MED ORDER — ONDANSETRON HCL 4 MG/2ML IJ SOLN
INTRAMUSCULAR | Status: AC
  Filled 2013-01-21: qty 2

## 2013-01-21 MED ORDER — ONDANSETRON HCL 4 MG/2ML IJ SOLN: 4.0000 mg | INTRAMUSCULAR | Status: DC | PRN

## 2013-01-21 MED ORDER — LACTATED RINGERS IV SOLN: INTRAVENOUS | Status: DC

## 2013-01-21 MED ORDER — SENNOSIDES-DOCUSATE SODIUM 8.6-50 MG PO TABS
2.0000 | ORAL_TABLET | Freq: Every day | ORAL | Status: DC
  Administered 2013-01-22 (×2): 2 via ORAL

## 2013-01-21 MED ORDER — NALBUPHINE HCL 10 MG/ML IJ SOLN
5.0000 mg | INTRAMUSCULAR | Status: DC | PRN
  Filled 2013-01-21: qty 1

## 2013-01-21 MED ORDER — OXYCODONE-ACETAMINOPHEN 5-325 MG PO TABS
1.0000 | ORAL_TABLET | ORAL | Status: DC | PRN
  Administered 2013-01-22 – 2013-01-23 (×7): 2 via ORAL
  Administered 2013-01-23: 1 via ORAL
  Filled 2013-01-21 (×8): qty 2

## 2013-01-21 MED ORDER — ONDANSETRON HCL 4 MG PO TABS: 4.0000 mg | ORAL_TABLET | ORAL | Status: DC | PRN

## 2013-01-21 MED ORDER — SODIUM CHLORIDE 0.9 % IJ SOLN: 3.0000 mL | INTRAMUSCULAR | Status: DC | PRN

## 2013-01-21 MED ORDER — CEFAZOLIN SODIUM-DEXTROSE 2-3 GM-% IV SOLR
INTRAVENOUS | Status: AC
  Administered 2013-01-21: 2 g via INTRAVENOUS
  Filled 2013-01-21: qty 50

## 2013-01-21 MED ORDER — FENTANYL CITRATE 0.05 MG/ML IJ SOLN: 25.0000 ug | INTRAMUSCULAR | Status: DC | PRN

## 2013-01-21 MED ORDER — OXYTOCIN 40 UNITS IN LACTATED RINGERS INFUSION - SIMPLE MED
INTRAVENOUS | Status: DC | PRN
  Administered 2013-01-21: 40 [IU] via INTRAVENOUS

## 2013-01-21 MED ORDER — MORPHINE SULFATE 0.5 MG/ML IJ SOLN
INTRAMUSCULAR | Status: AC
  Filled 2013-01-21: qty 10

## 2013-01-21 MED ORDER — NALBUPHINE SYRINGE 5 MG/0.5 ML
2.5000 mg | INJECTION | Freq: Once | INTRAMUSCULAR | Status: AC
  Administered 2013-01-21: 2.5 mg via INTRAVENOUS

## 2013-01-21 MED ORDER — MEPERIDINE HCL 25 MG/ML IJ SOLN: 6.2500 mg | INTRAMUSCULAR | Status: DC | PRN

## 2013-01-21 MED ORDER — FENTANYL CITRATE 0.05 MG/ML IJ SOLN
INTRAMUSCULAR | Status: AC
  Filled 2013-01-21: qty 2

## 2013-01-21 MED ORDER — SCOPOLAMINE 1 MG/3DAYS TD PT72
MEDICATED_PATCH | TRANSDERMAL | Status: AC
  Administered 2013-01-21: 1.5 mg via TRANSDERMAL
  Filled 2013-01-21: qty 1

## 2013-01-21 MED ORDER — MORPHINE SULFATE (PF) 0.5 MG/ML IJ SOLN
INTRAMUSCULAR | Status: DC | PRN
  Administered 2013-01-21: .15 mg via INTRATHECAL

## 2013-01-21 MED ORDER — FENTANYL CITRATE 0.05 MG/ML IJ SOLN
INTRAMUSCULAR | Status: DC | PRN
  Administered 2013-01-21: 25 ug via INTRATHECAL

## 2013-01-21 MED ORDER — DEXTROSE 5 % IV SOLN
1.0000 ug/kg/h | INTRAVENOUS | Status: DC | PRN
  Filled 2013-01-21: qty 2

## 2013-01-21 MED ORDER — BUPIVACAINE IN DEXTROSE 0.75-8.25 % IT SOLN
INTRATHECAL | Status: DC | PRN
  Administered 2013-01-21: 1.5 mL via INTRATHECAL

## 2013-01-21 MED ORDER — SIMETHICONE 80 MG PO CHEW
80.0000 mg | CHEWABLE_TABLET | Freq: Three times a day (TID) | ORAL | Status: DC
  Administered 2013-01-22 – 2013-01-23 (×6): 80 mg via ORAL

## 2013-01-21 MED ORDER — DIPHENHYDRAMINE HCL 25 MG PO CAPS: 25.0000 mg | ORAL_CAPSULE | ORAL | Status: DC | PRN

## 2013-01-21 MED ORDER — METOCLOPRAMIDE HCL 5 MG/ML IJ SOLN: 10.0000 mg | Freq: Three times a day (TID) | INTRAMUSCULAR | Status: DC | PRN

## 2013-01-21 MED ORDER — SIMETHICONE 80 MG PO CHEW: 80.0000 mg | CHEWABLE_TABLET | ORAL | Status: DC | PRN

## 2013-01-21 MED ORDER — NALOXONE HCL 0.4 MG/ML IJ SOLN: 0.4000 mg | INTRAMUSCULAR | Status: DC | PRN

## 2013-01-21 MED ORDER — OXYTOCIN 40 UNITS IN LACTATED RINGERS INFUSION - SIMPLE MED: 62.5000 mL/h | INTRAVENOUS | Status: AC

## 2013-01-21 MED ORDER — LANOLIN HYDROUS EX OINT: 1.0000 "application " | TOPICAL_OINTMENT | CUTANEOUS | Status: DC | PRN

## 2013-01-21 MED ORDER — SCOPOLAMINE 1 MG/3DAYS TD PT72
1.0000 | MEDICATED_PATCH | Freq: Once | TRANSDERMAL | Status: DC
  Administered 2013-01-21: 1.5 mg via TRANSDERMAL

## 2013-01-21 MED ORDER — KETOROLAC TROMETHAMINE 30 MG/ML IJ SOLN: 30.0000 mg | Freq: Four times a day (QID) | INTRAMUSCULAR | Status: AC | PRN

## 2013-01-21 MED ORDER — DIBUCAINE 1 % RE OINT: 1.0000 "application " | TOPICAL_OINTMENT | RECTAL | Status: DC | PRN

## 2013-01-21 MED ORDER — LACTATED RINGERS IV SOLN
INTRAVENOUS | Status: DC | PRN
  Administered 2013-01-21: 14:00:00 via INTRAVENOUS

## 2013-01-21 MED ORDER — DIPHENHYDRAMINE HCL 50 MG/ML IJ SOLN: 25.0000 mg | INTRAMUSCULAR | Status: DC | PRN

## 2013-01-21 MED ORDER — IBUPROFEN 600 MG PO TABS: 600.0000 mg | ORAL_TABLET | Freq: Four times a day (QID) | ORAL | Status: DC | PRN

## 2013-01-21 MED ORDER — ONDANSETRON HCL 4 MG/2ML IJ SOLN
INTRAMUSCULAR | Status: DC | PRN
  Administered 2013-01-21: 4 mg via INTRAVENOUS

## 2013-01-21 MED ORDER — DIPHENHYDRAMINE HCL 50 MG/ML IJ SOLN: 12.5000 mg | INTRAMUSCULAR | Status: DC | PRN

## 2013-01-21 SURGICAL SUPPLY — 34 items
ADH SKN CLS APL DERMABOND .7 (GAUZE/BANDAGES/DRESSINGS) ×1
BARRIER ADHS 3X4 INTERCEED (GAUZE/BANDAGES/DRESSINGS) IMPLANT
BRR ADH 4X3 ABS CNTRL BYND (GAUZE/BANDAGES/DRESSINGS)
CLOTH BEACON ORANGE TIMEOUT ST (SAFETY) ×2 IMPLANT
CONTAINER PREFILL 10% NBF 15ML (MISCELLANEOUS) IMPLANT
DERMABOND ADVANCED (GAUZE/BANDAGES/DRESSINGS) ×1
DERMABOND ADVANCED .7 DNX12 (GAUZE/BANDAGES/DRESSINGS) IMPLANT
DRAPE LG THREE QUARTER DISP (DRAPES) ×2 IMPLANT
DRSG OPSITE POSTOP 4X10 (GAUZE/BANDAGES/DRESSINGS) ×2 IMPLANT
DURAPREP 26ML APPLICATOR (WOUND CARE) ×2 IMPLANT
ELECT REM PT RETURN 9FT ADLT (ELECTROSURGICAL) ×2
ELECTRODE REM PT RTRN 9FT ADLT (ELECTROSURGICAL) ×1 IMPLANT
EXTRACTOR VACUUM M CUP 4 TUBE (SUCTIONS) IMPLANT
GLOVE BIO SURGEON STRL SZ 6.5 (GLOVE) ×4 IMPLANT
GOWN PREVENTION PLUS LG XLONG (DISPOSABLE) ×6 IMPLANT
KIT ABG SYR 3ML LUER SLIP (SYRINGE) IMPLANT
NDL HYPO 25X5/8 SAFETYGLIDE (NEEDLE) ×1 IMPLANT
NEEDLE HYPO 22GX1.5 SAFETY (NEEDLE) IMPLANT
NEEDLE HYPO 25X5/8 SAFETYGLIDE (NEEDLE) ×2 IMPLANT
NS IRRIG 1000ML POUR BTL (IV SOLUTION) ×2 IMPLANT
PACK C SECTION WH (CUSTOM PROCEDURE TRAY) ×2 IMPLANT
PAD OB MATERNITY 4.3X12.25 (PERSONAL CARE ITEMS) ×2 IMPLANT
SLEEVE SCD COMPRESS KNEE MED (MISCELLANEOUS) IMPLANT
STAPLER VISISTAT 35W (STAPLE) IMPLANT
SUT CHROMIC 0 CTX 36 (SUTURE) ×4 IMPLANT
SUT PLAIN 0 NONE (SUTURE) IMPLANT
SUT PLAIN 2 0 XLH (SUTURE) IMPLANT
SUT VIC AB 0 CT1 27 (SUTURE) ×6
SUT VIC AB 0 CT1 27XBRD ANBCTR (SUTURE) ×3 IMPLANT
SUT VIC AB 4-0 KS 27 (SUTURE) IMPLANT
SYR CONTROL 10ML LL (SYRINGE) IMPLANT
TOWEL OR 17X24 6PK STRL BLUE (TOWEL DISPOSABLE) ×6 IMPLANT
TRAY FOLEY CATH 14FR (SET/KITS/TRAYS/PACK) ×2 IMPLANT
WATER STERILE IRR 1000ML POUR (IV SOLUTION) ×2 IMPLANT

## 2013-01-21 NOTE — Addendum Note (Signed)
Addendum  created 01/21/13 1722 by Renford Dills, CRNA   Modules edited:Notes Section

## 2013-01-21 NOTE — Anesthesia Procedure Notes (Signed)
Spinal  Patient location during procedure: OR Start time: 01/21/2013 1:16 PM Staffing Anesthesiologist: Shloime Keilman A. Performed by: anesthesiologist  Preanesthetic Checklist Completed: patient identified, site marked, surgical consent, pre-op evaluation, timeout performed, IV checked, risks and benefits discussed and monitors and equipment checked Spinal Block Patient position: sitting Prep: site prepped and draped and DuraPrep Patient monitoring: heart rate, cardiac monitor, continuous pulse ox and blood pressure Approach: midline Location: L3-4 Injection technique: single-shot Needle Needle type: Sprotte  Needle gauge: 24 G Needle length: 9 cm Needle insertion depth: 7 cm Assessment Sensory level: T4 Additional Notes Patient tolerated procedure well. Adequate sensory level.

## 2013-01-21 NOTE — Brief Op Note (Signed)
01/21/2013  2:07 PM  PATIENT:  Jamie [name]  32 y.o. female  PRE-OPERATIVE DIAGNOSIS:  Previous Cesarean Section x 2, desires sterility  POST-OPERATIVE DIAGNOSIS:  Same  PROCEDURE:  Procedure(s) (LRB) with comments: CESAREAN SECTION WITH BILATERAL TUBAL LIGATION (Bilateral) - repeat edc 01/27/13  SURGEON:  Surgeon(s) and Role:    * Jeani Hawking, MD - Primary  PHYSICIAN ASSISTANT:   ASSISTANTS: none   ANESTHESIA:   spinal  EBL:  Total I/O In: 2500 [I.V.:2500] Out: 700 [Urine:200; Blood:500]  BLOOD ADMINISTERED:none  DRAINS: Urinary Catheter (Foley)   LOCAL MEDICATIONS USED:  NONE  SPECIMEN:  No Specimen  DISPOSITION OF SPECIMEN:  N/A  COUNTS:  YES  TOURNIQUET:  * No tourniquets in log *  DICTATION: .Other Dictation: Dictation Number [id number]  PLAN OF CARE: Admit to inpatient   PATIENT DISPOSITION:  PACU - hemodynamically stable.   Delay start of Pharmacological VTE agent (>24hrs) due to surgical blood loss or risk of bleeding: not applicable

## 2013-01-21 NOTE — Anesthesia Postprocedure Evaluation (Signed)
  Anesthesia Post-op Note  Patient: Jamie [name]  Procedure(s) Performed: Procedure(s) (LRB) with comments: CESAREAN SECTION WITH BILATERAL TUBAL LIGATION (Bilateral) - repeat edc 01/27/13  Patient Location: PACU  Anesthesia Type:Spinal  Level of Consciousness: awake, alert  and oriented  Airway and Oxygen Therapy: Patient Spontanous Breathing  Post-op Pain: mild  Post-op Assessment: Post-op Vital signs reviewed, Patient's Cardiovascular Status Stable, Respiratory Function Stable, Patent Airway, No signs of Nausea or vomiting, Pain level controlled, No headache, No backache, No residual numbness and No residual motor weakness  Post-op Vital Signs: Reviewed and stable  Complications: No apparent anesthesia complications

## 2013-01-21 NOTE — Progress Notes (Signed)
H and P on chart. No significant changes Will proceed with Repeat LTCS and BTL Consent signed.

## 2013-01-21 NOTE — Anesthesia Preprocedure Evaluation (Signed)
Anesthesia Evaluation  Patient identified by MRN, date of birth, ID band Patient awake    Reviewed: Allergy & Precautions, H&P , Patient's Chart, lab work & pertinent test results  Airway Mallampati: III TM Distance: >3 FB Neck ROM: Full    Dental No notable dental hx. (+) Teeth Intact   Pulmonary neg pulmonary ROS,  breath sounds clear to auscultation  Pulmonary exam normal       Cardiovascular negative cardio ROS  Rhythm:Regular Rate:Normal     Neuro/Psych negative neurological ROS  negative psych ROS   GI/Hepatic negative GI ROS, Neg liver ROS,   Endo/Other  diabetes, Well Controlled, Gestational, Oral Hypoglycemic AgentsMorbid obesity  Renal/GU negative Renal ROS  negative genitourinary   Musculoskeletal   Abdominal Normal abdominal exam  (+)   Peds  Hematology negative hematology ROS (+)   Anesthesia Other Findings   Reproductive/Obstetrics (+) Pregnancy                           Anesthesia Physical Anesthesia Plan  ASA: III  Anesthesia Plan: Spinal   Post-op Pain Management:    Induction:   Airway Management Planned:   Additional Equipment:   Intra-op Plan:   Post-operative Plan:   Informed Consent: I have reviewed the patients History and Physical, chart, labs and discussed the procedure including the risks, benefits and alternatives for the proposed anesthesia with the patient or authorized representative who has indicated his/her understanding and acceptance.     Plan Discussed with: Anesthesiologist, CRNA and Surgeon  Anesthesia Plan Comments:         Anesthesia Quick Evaluation

## 2013-01-21 NOTE — Transfer of Care (Signed)
Immediate Anesthesia Transfer of Care Note  Patient: Jamie [name]  Procedure(s) Performed: Procedure(s) (LRB) with comments: CESAREAN SECTION WITH BILATERAL TUBAL LIGATION (Bilateral) - repeat edc 01/27/13  Patient Location: PACU  Anesthesia Type:Spinal  Level of Consciousness: awake, alert  and oriented  Airway & Oxygen Therapy: Patient Spontanous Breathing  Post-op Assessment: Report given to PACU RN and Post -op Vital signs reviewed and stable  Post vital signs: Reviewed and stable  Complications: No apparent anesthesia complications

## 2013-01-21 NOTE — Anesthesia Postprocedure Evaluation (Signed)
  Anesthesia Post-op Note  Patient: Jamie [name]  Procedure(s) Performed: Procedure(s) (LRB) with comments: CESAREAN SECTION WITH BILATERAL TUBAL LIGATION (Bilateral) - repeat edc 01/27/13  Patient Location: Mother/Baby  Anesthesia Type:Spinal  Level of Consciousness: awake  Airway and Oxygen Therapy: Patient Spontanous Breathing  Post-op Pain: mild  Post-op Assessment: Patient's Cardiovascular Status Stable and Respiratory Function Stable  Post-op Vital Signs: stable  Complications: No apparent anesthesia complications

## 2013-01-21 NOTE — H&P (Signed)
32 year old G 3 P 2 ECS 01/28/2012 presents for Repeat LTCS and BTL. History of C Section x 2. Desires Permanent sterilization.  History of GDM this pregnancy well controlled. PNC see [facility]  Afebrile VSS General alert and oriented Lung CTAB Car RRR Abdomen is soft and non tender Cervix closed Extremities no edema  IMPRESSION: IUP at 39 weeks Previous C Section x 2 Desires permanent sterilization GDM  PLAN: Repeat LTCS  BTL Risks of surgery discussed with patient - risk of infection, anesthesia, bleeding, possible transfusion and injury to surrounding organs.

## 2013-01-21 NOTE — Op Note (Signed)
NAME:  Jamie [name], Jamie [name]                 ACCOUNT NO.:  [id number]  MEDICAL RECORD NO.:  [id number]  LOCATION:  9103                          FACILITY:  [facility]  PHYSICIAN:  Azarel Banner L. Fabien Travelstead, M.D.DATE OF BIRTH:  Feb 19, 1981  DATE OF PROCEDURE:  01/21/2013 DATE OF DISCHARGE:                              OPERATIVE REPORT   PREOPERATIVE DIAGNOSIS: 1. Intrauterine pregnancy at 32 and 1 weeks. 2. Previous cesarean section x2, and desires permanent sterilization.  POSTOPERATIVE DIAGNOSIS: 1. Intrauterine pregnancy at 18 and 1 weeks. 2. Previous cesarean section x2, and desires permanent sterilization.  PROCEDURE:  Repeat low transverse cesarean section and bilateral tubal ligation with modified Pomeroy method.  SURGEON:  Kyerra Vargo L. Vincente Poli, M.D.  ANESTHESIA:  Spinal.  ESTIMATED BLOOD LOSS:  Minimal.  COMPLICATIONS:  None.  PROCEDURE IN DETAIL:  The patient was taken to the operating room.  Her spinal was placed.  She was then prepped and draped in usual sterile fashion.  Low transverse incision was made and carried down to the fascia.  Fascia was scored in the midline and extended laterally. Rectus muscles were separated in midline.  Peritoneum was entered bluntly.  The peritoneal incision was then stretched.  The bladder blade was inserted.  The lower uterine segment was identified.  The bladder flap was created sharply and then digitally.  A low transverse incision was made in the uterus.  Amniotic fluid was clear.  The baby was in cephalic presentation, was delivered easily with a vacuum extractor x1 pull and no pop offs.  The baby was a vigorous female infant.  The cord was clamped and cut.  The baby was handed to the awaiting neonatal team. The placenta was manually removed and noted to be normal, and intact with a three-vessel cord.  The uterus was exteriorized and cleared of all clots and debris after the placenta was delivered.  The uterine incision was closed in 1 layer using 0  chromic in a running locked stitch.  Hemostasis was excellent.  At this point, we then proceeded with a bilateral tubal ligation by grasping each mid portion of the fallopian tube with Babcock clamp and tied off a 3 cm knuckle x2 with plain gut suture x2.  Each tied off a knuckle of fallopian tissue was then excised using Metzenbaum scissors.  Each was sent to pathology separately.  The uterus was returned to the abdomen.  Irrigation was performed.  Hemostasis was noted be excellent.  The peritoneum was closed using a Vicryl and running stitch.  The rectus muscles were reapproximated using 0 Vicryl.  The fascia was closed using 0 Vicryl running stitch starting each corner meeting in the midline.  After irrigation, subcutaneous layer, the skin was closed with a 4-0 Vicryl on a Keith needle.  Dermabond was applied.  All sponge, lap, and instrument counts were correct x2.  The patient went to recovery room in stable condition.     Nazanin Kinner L. Vincente Poli, M.D.     Florestine Avers  D:  01/21/2013  T:  01/21/2013  Job:  [id number]

## 2013-01-22 ENCOUNTER — Encounter (HOSPITAL_COMMUNITY): Payer: Self-pay | Admitting: Obstetrics and Gynecology

## 2013-01-22 LAB — CBC
HCT: 30 % — ABNORMAL LOW (ref 36.0–46.0)
Hemoglobin: 9.9 g/dL — ABNORMAL LOW (ref 12.0–15.0)
MCH: 28.2 pg (ref 26.0–34.0)
MCV: 85.5 fL (ref 78.0–100.0)
RBC: 3.51 MIL/uL — ABNORMAL LOW (ref 3.87–5.11)
WBC: 10.1 10*3/uL (ref 4.0–10.5)

## 2013-01-22 NOTE — Progress Notes (Signed)
Subjective: Postpartum Day one: Cesarean Delivery Patient reports tolerating PO.    Objective: Vital signs in last 24 hours: Temp:  [97.5 F (36.4 C)-98.7 F (37.1 C)] 97.7 F (36.5 C) (01/29 0551) Pulse Rate:  [69-93] 69  (01/29 0551) Resp:  [14-24] 18  (01/29 0551) BP: (98-121)/(50-79) 101/64 mmHg (01/29 0551) SpO2:  [95 %-100 %] 97 % (01/29 0551) Weight:  [100.699 kg (222 lb)] 100.699 kg (222 lb) (01/28 1829)  Physical Exam:  General: alert Lochia: appropriate Uterine Fundus: firm Incision: healing well DVT Evaluation: No evidence of DVT seen on physical exam.   Basename 01/22/13 0515 01/20/13 1525  HGB 9.9* 11.3*  HCT 30.0* 33.2*    Assessment/Plan: Status post Cesarean section. Doing well postoperatively.  Continue current care.  Khamora Karan S 01/22/2013, 8:18 AM

## 2013-01-23 MED ORDER — OXYCODONE-ACETAMINOPHEN 5-325 MG PO TABS: 1.0000 | ORAL_TABLET | Freq: Four times a day (QID) | ORAL | Status: DC | PRN

## 2013-01-23 MED ORDER — IBUPROFEN 200 MG PO TABS: 800.0000 mg | ORAL_TABLET | Freq: Three times a day (TID) | ORAL | Status: DC | PRN

## 2013-01-23 NOTE — Discharge Summary (Signed)
Obstetric Discharge Summary Reason for Admission: cesarean section Prenatal Procedures: ultrasound Intrapartum Procedures: cesarean: low cervical, transverse and tubal ligation Postpartum Procedures: none Complications-Operative and Postpartum: none Hemoglobin  Date Value Range Status  01/22/2013 9.9* 12.0 - 15.0 g/dL Final     HCT  Date Value Range Status  01/22/2013 30.0* 36.0 - 46.0 % Final    Physical Exam:  General: alert, cooperative and no distress Lochia: appropriate Uterine Fundus: firm Incision: healing well DVT Evaluation: No evidence of DVT seen on physical exam.  Discharge Diagnoses: Term Pregnancy-delivered  Discharge Information: Date: 01/23/2013 Activity: pelvic rest Diet: routine Medications: PNV, Ibuprofen and Percocet Condition: stable Instructions: refer to practice specific booklet Discharge to: home Follow-up Information    Call in 2 weeks to follow up.         Newborn Data: Live born female  Birth Weight: 8 lb 11.2 oz (3945 g) APGAR: 9, 9  Home with mother.  Jamie [name],Jamie [name] 01/23/2013, 8:22 AM

## 2013-01-23 NOTE — Progress Notes (Signed)
Subjective: Postpartum Day 2: Cesarean Delivery Patient reports incisional pain, tolerating PO, + flatus and no problems voiding.    Objective: Vital signs in last 24 hours: Temp:  [97.6 F (36.4 C)-97.9 F (36.6 C)] 97.6 F (36.4 C) (01/30 0527) Pulse Rate:  [78-80] 80  (01/30 0527) Resp:  [18] 18  (01/30 0527) BP: (104-114)/(68-72) 114/72 mmHg (01/30 0527)  Physical Exam:  General: alert and cooperative Lochia: appropriate Uterine Fundus: firm Incision: healing well DVT Evaluation: No evidence of DVT seen on physical exam. No significant calf/ankle edema.   Basename 01/22/13 0515 01/20/13 1525  HGB 9.9* 11.3*  HCT 30.0* 33.2*    Assessment/Plan: Status post Cesarean section. Doing well postoperatively.  Continue current care HX of GDM check FBS in Am.  Jamie [name] G 01/23/2013, 7:51 AM

## 2013-01-24 LAB — TYPE AND SCREEN
ABO/RH(D): O POS
Antibody Screen: NEGATIVE

## 2013-03-05 ENCOUNTER — Other Ambulatory Visit: Payer: Self-pay | Admitting: Obstetrics and Gynecology

## 2014-06-11 ENCOUNTER — Emergency Department (HOSPITAL_COMMUNITY)
Admission: EM | Admit: 2014-06-11 | Discharge: 2014-06-11 | Disposition: A | Discharge status: Home / Self Care | Payer: 59 | Attending: Emergency Medicine | Admitting: Emergency Medicine

## 2014-06-11 ENCOUNTER — Encounter (HOSPITAL_COMMUNITY): Payer: Self-pay | Admitting: Emergency Medicine

## 2014-06-11 DIAGNOSIS — Z3202 Encounter for pregnancy test, result negative: Secondary | ICD-10-CM | POA: Insufficient documentation

## 2014-06-11 DIAGNOSIS — Z792 Long term (current) use of antibiotics: Secondary | ICD-10-CM | POA: Insufficient documentation

## 2014-06-11 DIAGNOSIS — N12 Tubulo-interstitial nephritis, not specified as acute or chronic: Secondary | ICD-10-CM

## 2014-06-11 DIAGNOSIS — Z79899 Other long term (current) drug therapy: Secondary | ICD-10-CM | POA: Insufficient documentation

## 2014-06-11 DIAGNOSIS — Z8632 Personal history of gestational diabetes: Secondary | ICD-10-CM | POA: Insufficient documentation

## 2014-06-11 DIAGNOSIS — Z87891 Personal history of nicotine dependence: Secondary | ICD-10-CM | POA: Insufficient documentation

## 2014-06-11 LAB — BASIC METABOLIC PANEL
BUN: 8 mg/dL (ref 6–23)
CALCIUM: 8.7 mg/dL (ref 8.4–10.5)
CO2: 19 mEq/L (ref 19–32)
CREATININE: 0.8 mg/dL (ref 0.50–1.10)
Chloride: 100 mEq/L (ref 96–112)
GFR calc Af Amer: >90 mL/min (ref >90)
Glucose, Bld: 103 mg/dL — ABNORMAL HIGH (ref 70–99)
Potassium: 4.1 mEq/L (ref 3.7–5.3)
Sodium: 137 mEq/L (ref 137–147)

## 2014-06-11 LAB — CBC WITH DIFFERENTIAL/PLATELET
Basophils Absolute: 0 10*3/uL (ref 0.0–0.1)
Basophils Relative: 0 % (ref 0–1)
EOS PCT: 1 % (ref 0–5)
Eosinophils Absolute: 0.1 10*3/uL (ref 0.0–0.7)
HCT: 35.6 % — ABNORMAL LOW (ref 36.0–46.0)
Hemoglobin: 11.9 g/dL — ABNORMAL LOW (ref 12.0–15.0)
LYMPHS ABS: 0.5 10*3/uL — AB (ref 0.7–4.0)
Lymphocytes Relative: 4 % — ABNORMAL LOW (ref 12–46)
MCH: 29.5 pg (ref 26.0–34.0)
MCHC: 33.4 g/dL (ref 30.0–36.0)
MCV: 88.1 fL (ref 78.0–100.0)
Monocytes Absolute: 0.2 10*3/uL (ref 0.1–1.0)
Monocytes Relative: 2 % — ABNORMAL LOW (ref 3–12)
Neutro Abs: 11.9 10*3/uL — ABNORMAL HIGH (ref 1.7–7.7)
Neutrophils Relative %: 93 % — ABNORMAL HIGH (ref 43–77)
PLATELETS: 227 10*3/uL (ref 150–400)
RBC: 4.04 MIL/uL (ref 3.87–5.11)
RDW: 13.6 % (ref 11.5–15.5)
WBC: 12.7 10*3/uL — ABNORMAL HIGH (ref 4.0–10.5)

## 2014-06-11 LAB — URINALYSIS, ROUTINE W REFLEX MICROSCOPIC
Bilirubin Urine: NEGATIVE
GLUCOSE, UA: NEGATIVE mg/dL
Ketones, ur: NEGATIVE mg/dL
Nitrite: POSITIVE — AB
PROTEIN: 30 mg/dL — AB
Specific Gravity, Urine: 1.014 (ref 1.005–1.030)
Urobilinogen, UA: 0.2 mg/dL (ref 0.0–1.0)
pH: 6 (ref 5.0–8.0)

## 2014-06-11 LAB — POC URINE PREG, ED: Preg Test, Ur: NEGATIVE

## 2014-06-11 LAB — URINE MICROSCOPIC-ADD ON

## 2014-06-11 LAB — LIPASE, BLOOD: Lipase: 35 U/L (ref 11–59)

## 2014-06-11 MED ORDER — DEXTROSE 5 % IV SOLN
1.0000 g | Freq: Once | INTRAVENOUS | Status: AC
  Administered 2014-06-11: 1 g via INTRAVENOUS
  Filled 2014-06-11: qty 10

## 2014-06-11 MED ORDER — ONDANSETRON 8 MG PO TBDP: 8.0000 mg | ORAL_TABLET | Freq: Three times a day (TID) | ORAL | Status: DC | PRN

## 2014-06-11 MED ORDER — SODIUM CHLORIDE 0.9 % IV BOLUS (SEPSIS)
1000.0000 mL | Freq: Once | INTRAVENOUS | Status: AC
  Administered 2014-06-11: 1000 mL via INTRAVENOUS

## 2014-06-11 MED ORDER — CEPHALEXIN 500 MG PO CAPS: 500.0000 mg | ORAL_CAPSULE | Freq: Two times a day (BID) | ORAL | Status: DC

## 2014-06-11 NOTE — ED Notes (Signed)
Pt tolerating PO fluids well. Feels much better after zofran en route.

## 2014-06-11 NOTE — Discharge Instructions (Signed)
Please return to the ER if your symptoms worsen; you have increased pain, fevers, chills, inability to keep any medications down, confusion.    Pyelonephritis, Adult Pyelonephritis is a kidney infection. In general, there are 2 main types of pyelonephritis:  Infections that come on quickly without any warning (acute pyelonephritis).  Infections that persist for a long period of time (chronic pyelonephritis). CAUSES  Two main causes of pyelonephritis are:  Bacteria traveling from the bladder to the kidney. This is a problem especially in pregnant women. The urine in the bladder can become filled with bacteria from multiple causes, including:  Inflammation of the prostate gland (prostatitis).  Sexual intercourse in females.  Bladder infection (cystitis).  Bacteria traveling from the bloodstream to the tissue part of the kidney. Problems that may increase your risk of getting a kidney infection include:  Diabetes.  Kidney stones or bladder stones.  Cancer.  Catheters placed in the bladder.  Other abnormalities of the kidney or ureter. SYMPTOMS   Abdominal pain.  Pain in the side or flank area.  Fever.  Chills.  Upset stomach.  Blood in the urine (dark urine).  Frequent urination.  Strong or persistent urge to urinate.  Burning or stinging when urinating. DIAGNOSIS  Your caregiver may diagnose your kidney infection based on your symptoms. A urine sample may also be taken. TREATMENT  In general, treatment depends on how severe the infection is.   If the infection is mild and caught early, your caregiver may treat you with oral antibiotics and send you home.  If the infection is more severe, the bacteria may have gotten into the bloodstream. This will require intravenous (IV) antibiotics and a hospital stay. Symptoms may include:  High fever.  Severe flank pain.  Shaking chills.  Even after a hospital stay, your caregiver may require you to be on oral  antibiotics for a period of time.  Other treatments may be required depending upon the cause of the infection. HOME CARE INSTRUCTIONS   Take your antibiotics as directed. Finish them even if you start to feel better.  Make an appointment to have your urine checked to make sure the infection is gone.  Drink enough fluids to keep your urine clear or pale yellow.  Take medicines for the bladder if you have urgency and frequency of urination as directed by your caregiver. SEEK IMMEDIATE MEDICAL CARE IF:   You have a fever or persistent symptoms for more than 2-3 days.  You have a fever and your symptoms suddenly get worse.  You are unable to take your antibiotics or fluids.  You develop shaking chills.  You experience extreme weakness or fainting.  There is no improvement after 2 days of treatment. MAKE SURE YOU:  Understand these instructions.  Will watch your condition.  Will get help right away if you are not doing well or get worse. Document Released: 12/11/2005 Document Revised: 06/11/2012 Document Reviewed: 05/17/2011 [facility] Patient Information 2015 [place], [place]. This information is not intended to replace advice given to you by your health care provider. Make sure you discuss any questions you have with your health care provider.

## 2014-06-11 NOTE — ED Notes (Signed)
Per EMS, pt has had nausea and vomiting since this morning. Pt was diagnosed with UTI 3 days ago, was given abx. Pt states she still has urinary symptoms. Pt was given 4mg  zofran and 200ml fluid. EMS reports temperature 101.8.

## 2014-06-11 NOTE — ED Notes (Signed)
Patient requested ice. of ice was given to patient.

## 2014-06-13 LAB — URINE CULTURE: SPECIAL REQUESTS: NORMAL

## 2014-06-14 ENCOUNTER — Telehealth (HOSPITAL_BASED_OUTPATIENT_CLINIC_OR_DEPARTMENT_OTHER): Payer: Self-pay | Admitting: Emergency Medicine

## 2014-06-14 NOTE — Telephone Encounter (Signed)
Post ED Visit - Positive Culture Follow-up  Culture report reviewed by antimicrobial stewardship pharmacist: [x]  Christoper Fabianaron Amend, Pharm.D., BCPS []  Celedonio MiyamotoJeremy Frens, Pharm.D., BCPS []  Georgina PillionElizabeth Martin, Pharm.D., BCPS []  LuckeyMinh Pham, [address] Rainbow BoulevardPharm.D., BCPS, AAHIVP []  Estella HuskMichelle Turner, Pharm.D., BCPS, AAHIVP []  Harvie JuniorNathan Cope, Pharm.D.  Positive Urine culture Treated with Cephalexin, organism sensitive to the same and no further patient follow-up is required at this time.  Jiles HaroldGammons, Shannon Chaney 06/14/2014, 6:54 PM

## 2014-06-23 NOTE — ED Provider Notes (Signed)
CSN: [id number]     Arrival date & time 06/11/14  1859 History   First MD Initiated Contact with Patient 06/11/14 1936     Chief Complaint  Patient presents with  . Nausea  . Emesis     (Consider location/radiation/quality/duration/timing/severity/associated sxs/prior Treatment) HPI Comments: Pt comes in with cc of nausea and emesis. Pt was diagnosed with UTI just few days ago, and was started on bactrim. She reports that she is almost done with her antibiotics, but she still has dysuria, some back pain, fevers, and nausea with emesis.  Patient is a 33 y.o. female presenting with vomiting. The history is provided by the patient.  Emesis Associated symptoms: no abdominal pain and no diarrhea     Past Medical History  Diagnosis Date  . Gestational diabetes    Past Surgical History  Procedure Laterality Date  . Cesarean section    . Dilation and curettage of uterus    . Cesarean section with bilateral tubal ligation  01/21/2013    Procedure: CESAREAN SECTION WITH BILATERAL TUBAL LIGATION;  Surgeon: Jeani HawkingMichelle L Grewal, MD;  Location: [facility];  Service: Obstetrics;  Laterality: Bilateral;  repeat edc 01/27/13   Family History  Problem Relation Age of Onset  . Other Neg Hx    History  Substance Use Topics  . Smoking status: Former Games developermoker  . Smokeless tobacco: Not on file  . Alcohol Use: No   OB History   Grav Para Term Preterm Abortions TAB SAB Ect Mult Living   7 1 1  4 2 2   3      Review of Systems  Constitutional: Negative for activity change.  HENT: Negative for facial swelling.   Respiratory: Negative for cough, shortness of breath and wheezing.   Cardiovascular: Negative for chest pain.  Gastrointestinal: Positive for nausea and vomiting. Negative for abdominal pain, diarrhea, constipation, blood in stool and abdominal distention.  Genitourinary: Positive for dysuria. Negative for hematuria and difficulty urinating.  Musculoskeletal: Positive for back pain. Negative for  neck pain.  Skin: Negative for color change.  Neurological: Negative for speech difficulty.  Hematological: Does not bruise/bleed easily.  Psychiatric/Behavioral: Negative for confusion.      Allergies  Review of patient's allergies indicates no known allergies.  Home Medications   Prior to Admission medications   Medication Sig Start Date End Date Taking? Authorizing Peityn Payton  clonazePAM (KLONOPIN) 0.5 MG tablet Take 0.5 mg by mouth daily as needed for anxiety.  05/23/14  Yes Historical Damarri Rampy, MD  ibuprofen (ADVIL) 200 MG tablet Take 4 tablets (800 mg total) by mouth every 8 (eight) hours as needed for pain. 01/23/13  Yes Roselle LocusJames E Tomblin II, MD  sertraline (ZOLOFT) 50 MG tablet Take 150 mg by mouth daily.   Yes Historical Hazen Brumett, MD  sulfamethoxazole-trimethoprim (BACTRIM DS) 800-160 MG per tablet Take 1 tablet by mouth 2 (two) times daily.   Yes Historical Lateshia Schmoker, MD  cephALEXin (KEFLEX) 500 MG capsule Take 1 capsule (500 mg total) by mouth 2 (two) times daily. 06/11/14   Derwood KaplanAnkit Nanavati, MD  ondansetron (ZOFRAN ODT) 8 MG disintegrating tablet Take 1 tablet (8 mg total) by mouth every 8 (eight) hours as needed for nausea. 06/11/14   Ankit Nanavati, MD   BP 108/65  Pulse 99  Temp(Src) 98.4 F (36.9 C) (Oral)  Resp 18  SpO2 97% Physical Exam  Nursing note and vitals reviewed. Constitutional: She is oriented to person, place, and time. She appears well-developed and well-nourished.  HENT:  Head: Normocephalic and atraumatic.  Eyes: EOM are normal. Pupils are equal, round, and reactive to light.  Neck: Neck supple.  Cardiovascular: Normal rate, regular rhythm and normal heart sounds.   No murmur heard. Pulmonary/Chest: Effort normal. No respiratory distress.  Abdominal: Soft. She exhibits no distension. There is no tenderness. There is no rebound and no guarding.  Neurological: She is alert and oriented to person, place, and time.  Skin: Skin is warm and dry.    ED Course   Procedures (including critical care time) Labs Review Labs Reviewed  CBC WITH DIFFERENTIAL - Abnormal; Notable for the following:    WBC 12.7 (*)    Hemoglobin 11.9 (*)    HCT 35.6 (*)    Neutrophils Relative % 93 (*)    Neutro Abs 11.9 (*)    Lymphocytes Relative 4 (*)    Lymphs Abs 0.5 (*)    Monocytes Relative 2 (*)    All other components within normal limits  URINALYSIS, ROUTINE W REFLEX MICROSCOPIC - Abnormal; Notable for the following:    APPearance CLOUDY (*)    Hgb urine dipstick LARGE (*)    Protein, ur 30 (*)    Nitrite POSITIVE (*)    Leukocytes, UA LARGE (*)    All other components within normal limits  BASIC METABOLIC PANEL - Abnormal; Notable for the following:    Glucose, Bld 103 (*)    All other components within normal limits  URINE MICROSCOPIC-ADD ON - Abnormal; Notable for the following:    Bacteria, UA FEW (*)    All other components within normal limits  URINE CULTURE  LIPASE, BLOOD  POC URINE PREG, ED    Imaging Review No results found.   EKG Interpretation None      MDM   Final diagnoses:  Pyelonephritis    Pt with pyelonephritis clinically. Was given bactrim last time, will switch to keflex. Oral challenge passed. Pt's urina her been cultured. Nausea meds provided, return precautions discussed.   Derwood KaplanAnkit Nanavati, MD 06/23/14 [phone]

## 2014-10-26 ENCOUNTER — Encounter (HOSPITAL_COMMUNITY): Payer: Self-pay | Admitting: Emergency Medicine

## 2015-09-20 ENCOUNTER — Encounter (HOSPITAL_COMMUNITY): Payer: Self-pay

## 2015-09-20 ENCOUNTER — Emergency Department (HOSPITAL_COMMUNITY)
Admission: EM | Admit: 2015-09-20 | Discharge: 2015-09-21 | Disposition: A | Discharge status: Home / Self Care | Payer: 59 | Attending: Emergency Medicine | Admitting: Emergency Medicine

## 2015-09-20 DIAGNOSIS — Z79899 Other long term (current) drug therapy: Secondary | ICD-10-CM | POA: Insufficient documentation

## 2015-09-20 DIAGNOSIS — R42 Dizziness and giddiness: Secondary | ICD-10-CM | POA: Diagnosis not present

## 2015-09-20 DIAGNOSIS — Z87891 Personal history of nicotine dependence: Secondary | ICD-10-CM | POA: Diagnosis not present

## 2015-09-20 DIAGNOSIS — Z792 Long term (current) use of antibiotics: Secondary | ICD-10-CM | POA: Insufficient documentation

## 2015-09-20 DIAGNOSIS — R200 Anesthesia of skin: Secondary | ICD-10-CM | POA: Diagnosis not present

## 2015-09-20 DIAGNOSIS — R079 Chest pain, unspecified: Secondary | ICD-10-CM | POA: Diagnosis not present

## 2015-09-20 DIAGNOSIS — R0602 Shortness of breath: Secondary | ICD-10-CM | POA: Diagnosis not present

## 2015-09-20 DIAGNOSIS — Z8632 Personal history of gestational diabetes: Secondary | ICD-10-CM | POA: Diagnosis not present

## 2015-09-20 NOTE — ED Notes (Signed)
Per EMS pt had been aurguing with family and had sudden onset of right chest pain with right side numbness to arm and [place]; pt had pain 10/10 and was given 1 nitro and 325 asa; pt states no pain on arrival; Pt  Has hx of anxiety and takes; Pt states she has had dizzy spells the past 3 days; Pt a&o  And ambulatory on arrival.

## 2015-09-20 NOTE — ED Provider Notes (Signed)
CSN: [id number]     Arrival date & time 09/20/15  2340 History  By signing my name below, I, Phillis Haggis, attest that this documentation has been prepared under the direction and in the presence of Azalia Bilis, MD. Electronically Signed: Phillis Haggis, ED Scribe. 09/21/2015. 1:14 AM  Chief Complaint  Patient presents with  . Chest Pain   The history is provided by the patient. No language interpreter was used.   HPI Comments: Jamie [name] is a 34 y.o. Female with hx of gestational diabetes and anxiety brought in by EMS who presents to the Emergency Department complaining of sudden onset right chest pain onset PTA. Pt states that she works for [employer] and is a single mother of 3 and has been very stressed today. She states that she was arguing with her family over the phone when she had a sudden onset of right chest pain underneath her right breast to the middle of her chest with right arm numbness. She states she called her family members with hx of HTN to see if her symptoms were BP related, but does not have a hx of HTN. BP was 160/100 en route and she states that it has never been this high. Reports associated SOB when it first happened. She states that when the EMS arrived, her numbness radiated to her neck and face. She reports that she has had dizziness the past three days. Reports that the chest discomfort and numbness is gradually resolving. Denies abnormal gait.  Past Medical History  Diagnosis Date  . Gestational diabetes    Past Surgical History  Procedure Laterality Date  . Cesarean section    . Dilation and curettage of uterus    . Cesarean section with bilateral tubal ligation  01/21/2013    Procedure: CESAREAN SECTION WITH BILATERAL TUBAL LIGATION;  Surgeon: Jeani Hawking, MD;  Location: [facility];  Service: Obstetrics;  Laterality: Bilateral;  repeat edc 01/27/13   Family History  Problem Relation Age of Onset  . Other Neg Hx    Social History  Substance Use Topics  .  Smoking status: Former Games developer  . Smokeless tobacco: Not on file  . Alcohol Use: No   OB History    Gravida Para Term Preterm AB TAB SAB Ectopic Multiple Living   Review of Systems  Respiratory: Positive for shortness of breath.   Cardiovascular: Positive for chest pain.  Musculoskeletal: Negative for gait problem.  Neurological: Positive for dizziness and numbness.  All other systems reviewed and are negative.  Allergies  Review of patient's allergies indicates no known allergies.  Home Medications   Prior to Admission medications   Medication Sig Start Date End Date Taking? Authorizing Provider  cephALEXin (KEFLEX) 500 MG capsule Take 1 capsule (500 mg total) by mouth 2 (two) times daily. 06/11/14   Derwood Kaplan, MD  clonazePAM (KLONOPIN) 0.5 MG tablet Take 0.5 mg by mouth daily as needed for anxiety.  05/23/14   Historical Provider, MD  ibuprofen (ADVIL) 200 MG tablet Take 4 tablets (800 mg total) by mouth every 8 (eight) hours as needed for pain. 01/23/13   Harold Hedge, MD  ondansetron (ZOFRAN ODT) 8 MG disintegrating tablet Take 1 tablet (8 mg total) by mouth every 8 (eight) hours as needed for nausea. 06/11/14   Derwood Kaplan, MD  sertraline (ZOLOFT) 50 MG tablet Take 150 mg by mouth daily.    Historical Provider,  MD  sulfamethoxazole-trimethoprim (BACTRIM DS) 800-160 MG per tablet Take 1 tablet by mouth 2 (two) times daily.    Historical Provider, MD   BP 141/97 mmHg  Pulse 66  Temp(Src) 98.4 F (36.9 C) (Oral)  Resp 18  Ht  (1.6 m)  Wt 191 lb (86.637 kg)  BMI 33.84 kg/m2  SpO2 98%  LMP 09/10/2015 (Approximate)  Physical Exam  Constitutional: She is oriented to person, place, and time. She appears well-developed and well-nourished. No distress.  HENT:  Head: Normocephalic and atraumatic.  Eyes: EOM are normal.  Neck: Normal range of motion.  Cardiovascular: Normal rate, regular rhythm and normal heart sounds.   BP 139/102 during exam,  right arm. 138/90, left arm  Pulmonary/Chest: Effort normal and breath sounds normal.  Abdominal: Soft. She exhibits no distension. There is no tenderness.  Musculoskeletal: Normal range of motion.  Neurological: She is alert and oriented to person, place, and time.  Skin: Skin is warm and dry.  Psychiatric: She has a normal mood and affect. Judgment normal.  Nursing note and vitals reviewed.   ED Course  Procedures (including critical care time) DIAGNOSTIC STUDIES: Oxygen Saturation is 98% on RA, normal by my interpretation.    COORDINATION OF CARE: 12:03 AM-Discussed treatment plan which includes labs and x-ray with pt at bedside and pt agreed to plan.   12:05 AM- Pt states that she feels like the numbness is going away  Labs Review Labs Reviewed  COMPREHENSIVE METABOLIC PANEL - Abnormal; Notable for the following:    Glucose, Bld 117 (*)    BUN <5 (*)    Calcium 8.8 (*)    All other components within normal limits  CBC  TROPONIN I  LIPASE, BLOOD  I-STAT TROPOININ, ED   Imaging Review Dg Chest 2 View  09/21/2015   CLINICAL DATA:  Chest pain with right-sided numbness  EXAM: CHEST  2 VIEW  COMPARISON:  None.  FINDINGS: Normal heart size and mediastinal contours. No acute infiltrate or edema. No effusion or pneumothorax. No acute osseous findings.  IMPRESSION: Negative chest.   Electronically Signed   By: Marnee Spring M.D.   On: 09/21/2015 00:34  I personally reviewed the imaging tests through PACS system I reviewed available ER/hospitalization records through the EMR    EKG Interpretation   Date/Time:  Monday September 20 2015 23:57:26 EDT Ventricular Rate:  77 PR Interval:  143 QRS Duration: 89 QT Interval:  407 QTC Calculation: 461 R Axis:   67 Text Interpretation:  Sinus rhythm No old tracing to compare Confirmed by  CAMPOS  MD, KEVIN ([id number]) on 09/21/2015 12:05:32 AM      MDM   Final diagnoses:  Chest pain, unspecified chest pain type    3:27  AM Patient is a symptomatically this time.  She feels much better.  My suspicion for acute coronary syndrome is low.  Doubt pulmonary embolism.  No symptoms at this time.  Discharge home with primary care follow-up.  Doubt dissection.  Blood pressures in both arms are normal.   I personally performed the services described in this documentation, which was scribed in my presence. The recorded information has been reviewed and is accurate.        Azalia Bilis, MD 09/21/15 [phone]

## 2015-09-21 ENCOUNTER — Emergency Department (HOSPITAL_COMMUNITY): Payer: 59

## 2015-09-21 LAB — COMPREHENSIVE METABOLIC PANEL
ALK PHOS: 69 U/L (ref 38–126)
ALT: 23 U/L (ref 14–54)
AST: 25 U/L (ref 15–41)
Albumin: 4.1 g/dL (ref 3.5–5.0)
Anion gap: 11 (ref 5–15)
BILIRUBIN TOTAL: 0.6 mg/dL (ref 0.3–1.2)
CALCIUM: 8.8 mg/dL — AB (ref 8.9–10.3)
CO2: 24 mmol/L (ref 22–32)
Chloride: 101 mmol/L (ref 101–111)
Creatinine, Ser: 0.74 mg/dL (ref 0.44–1.00)
GFR calc Af Amer: >60 mL/min (ref >60)
GFR calc non Af Amer: >60 mL/min (ref >60)
GLUCOSE: 117 mg/dL — AB (ref 65–99)
Potassium: 3.6 mmol/L (ref 3.5–5.1)
Sodium: 136 mmol/L (ref 135–145)
TOTAL PROTEIN: 7.4 g/dL (ref 6.5–8.1)

## 2015-09-21 LAB — CBC
HCT: 38.4 % (ref 36.0–46.0)
HEMOGLOBIN: 13.1 g/dL (ref 12.0–15.0)
MCH: 29.7 pg (ref 26.0–34.0)
MCHC: 34.1 g/dL (ref 30.0–36.0)
MCV: 87.1 fL (ref 78.0–100.0)
Platelets: 245 10*3/uL (ref 150–400)
RBC: 4.41 MIL/uL (ref 3.87–5.11)
RDW: 13.5 % (ref 11.5–15.5)
WBC: 9.3 10*3/uL (ref 4.0–10.5)

## 2015-09-21 LAB — I-STAT TROPONIN, ED: Troponin i, poc: 0 ng/mL (ref 0.00–0.08)

## 2015-09-21 LAB — LIPASE, BLOOD: Lipase: 33 U/L (ref 22–51)

## 2015-09-21 LAB — TROPONIN I: Troponin I: <0.03 ng/mL (ref <0.031)

## 2015-09-21 MED ORDER — KETOROLAC TROMETHAMINE 30 MG/ML IJ SOLN
30.0000 mg | Freq: Once | INTRAMUSCULAR | Status: AC
  Administered 2015-09-21: 30 mg via INTRAVENOUS
  Filled 2015-09-21: qty 1

## 2015-09-21 NOTE — ED Notes (Signed)
Patient transported to X-ray 

## 2015-09-21 NOTE — ED Notes (Signed)
MD at bedside. 

## 2015-09-21 NOTE — Discharge Instructions (Signed)

## 2016-07-17 ENCOUNTER — Other Ambulatory Visit: Payer: Self-pay | Admitting: Internal Medicine

## 2016-07-17 ENCOUNTER — Ambulatory Visit
Admission: RE | Admit: 2016-07-17 | Discharge: 2016-07-17 | Disposition: A | Discharge status: Home / Self Care | Payer: 59 | Source: Ambulatory Visit | Attending: Internal Medicine | Admitting: Internal Medicine

## 2016-07-17 DIAGNOSIS — R52 Pain, unspecified: Secondary | ICD-10-CM

## 2016-11-08 IMAGING — DX DG CHEST 2V
2 series · 2 of 2 positions shown · non-contrast
Comparison: None.

CLINICAL DATA: Chest pain with right-sided numbness

EXAM:
CHEST  2 VIEW

[w chest pa]
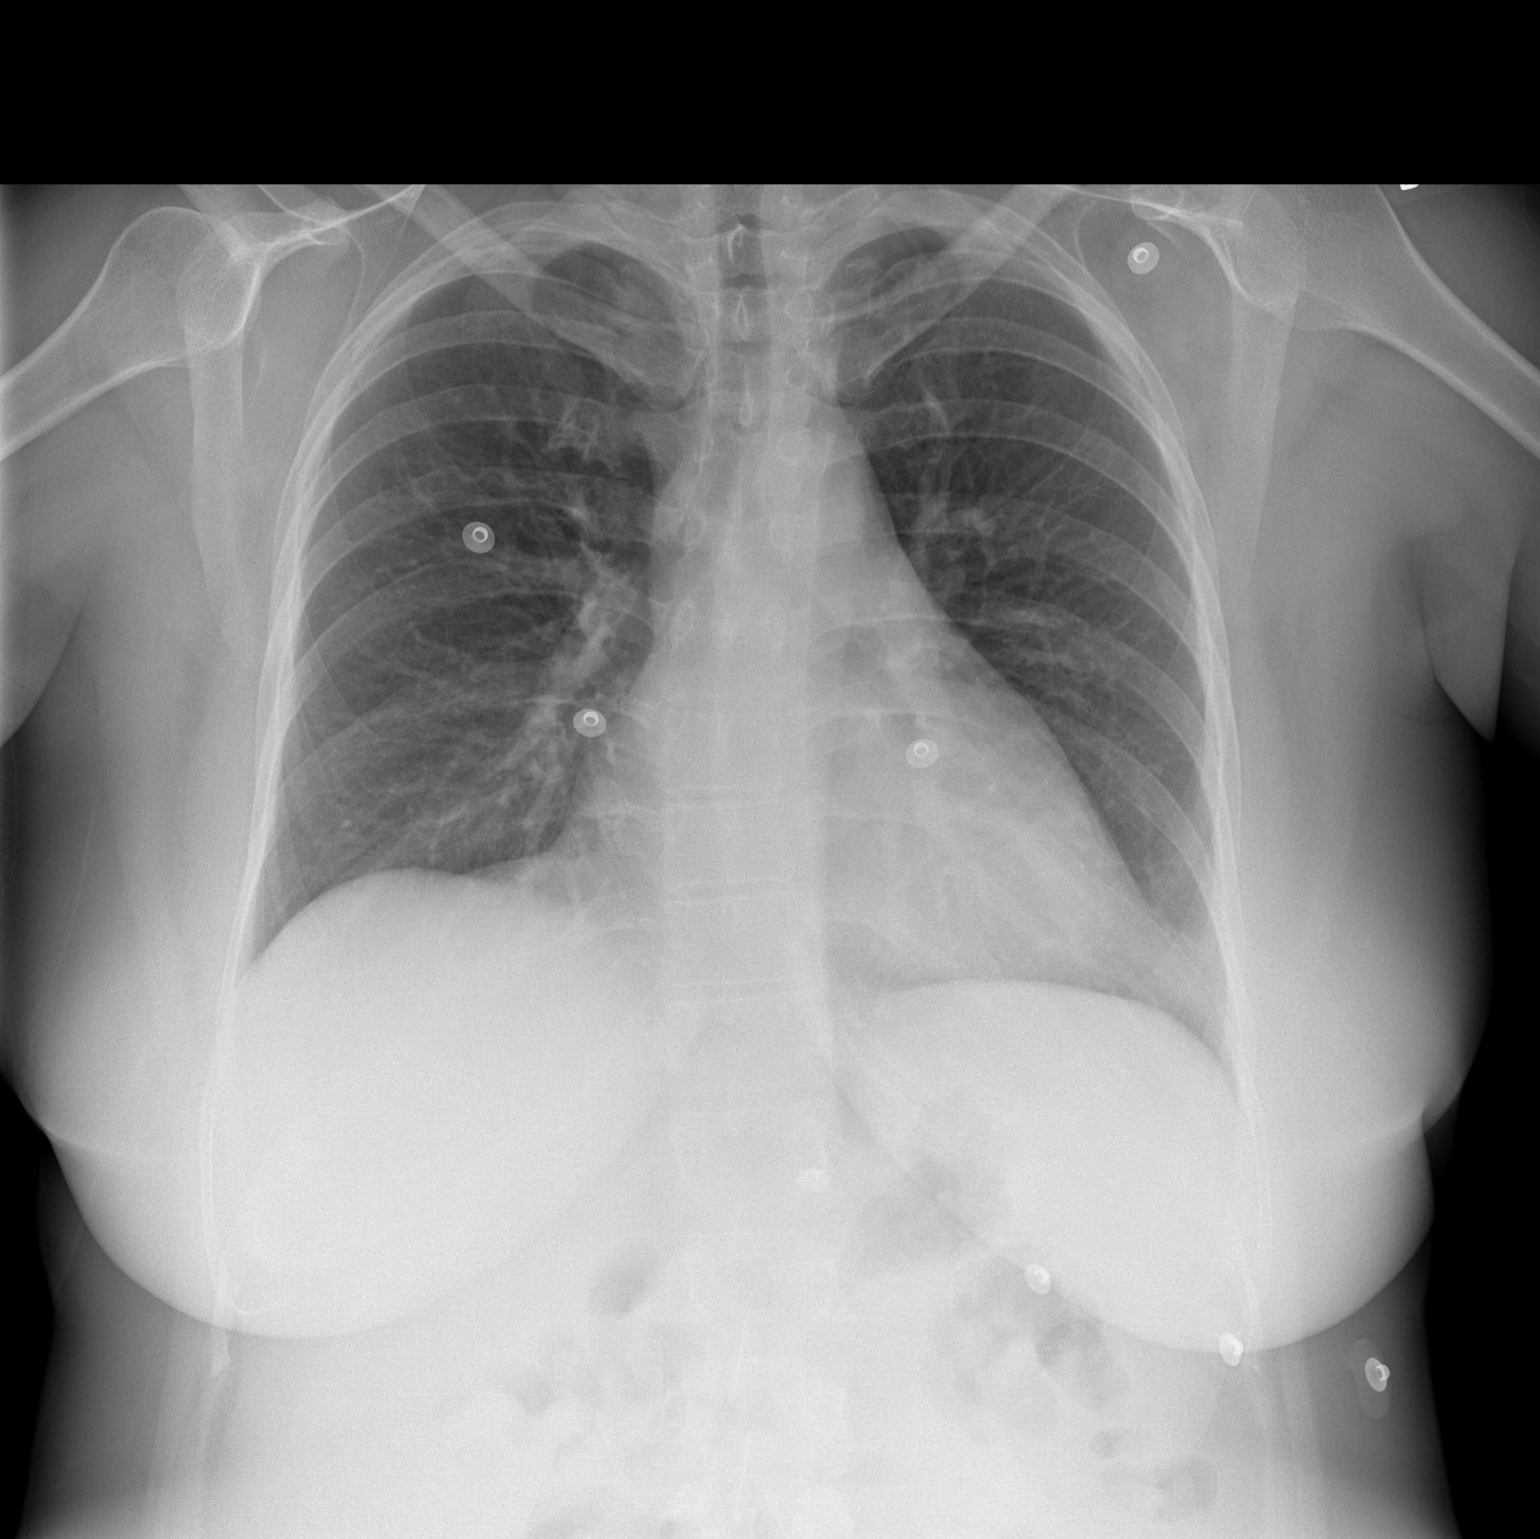

[w chest lat]
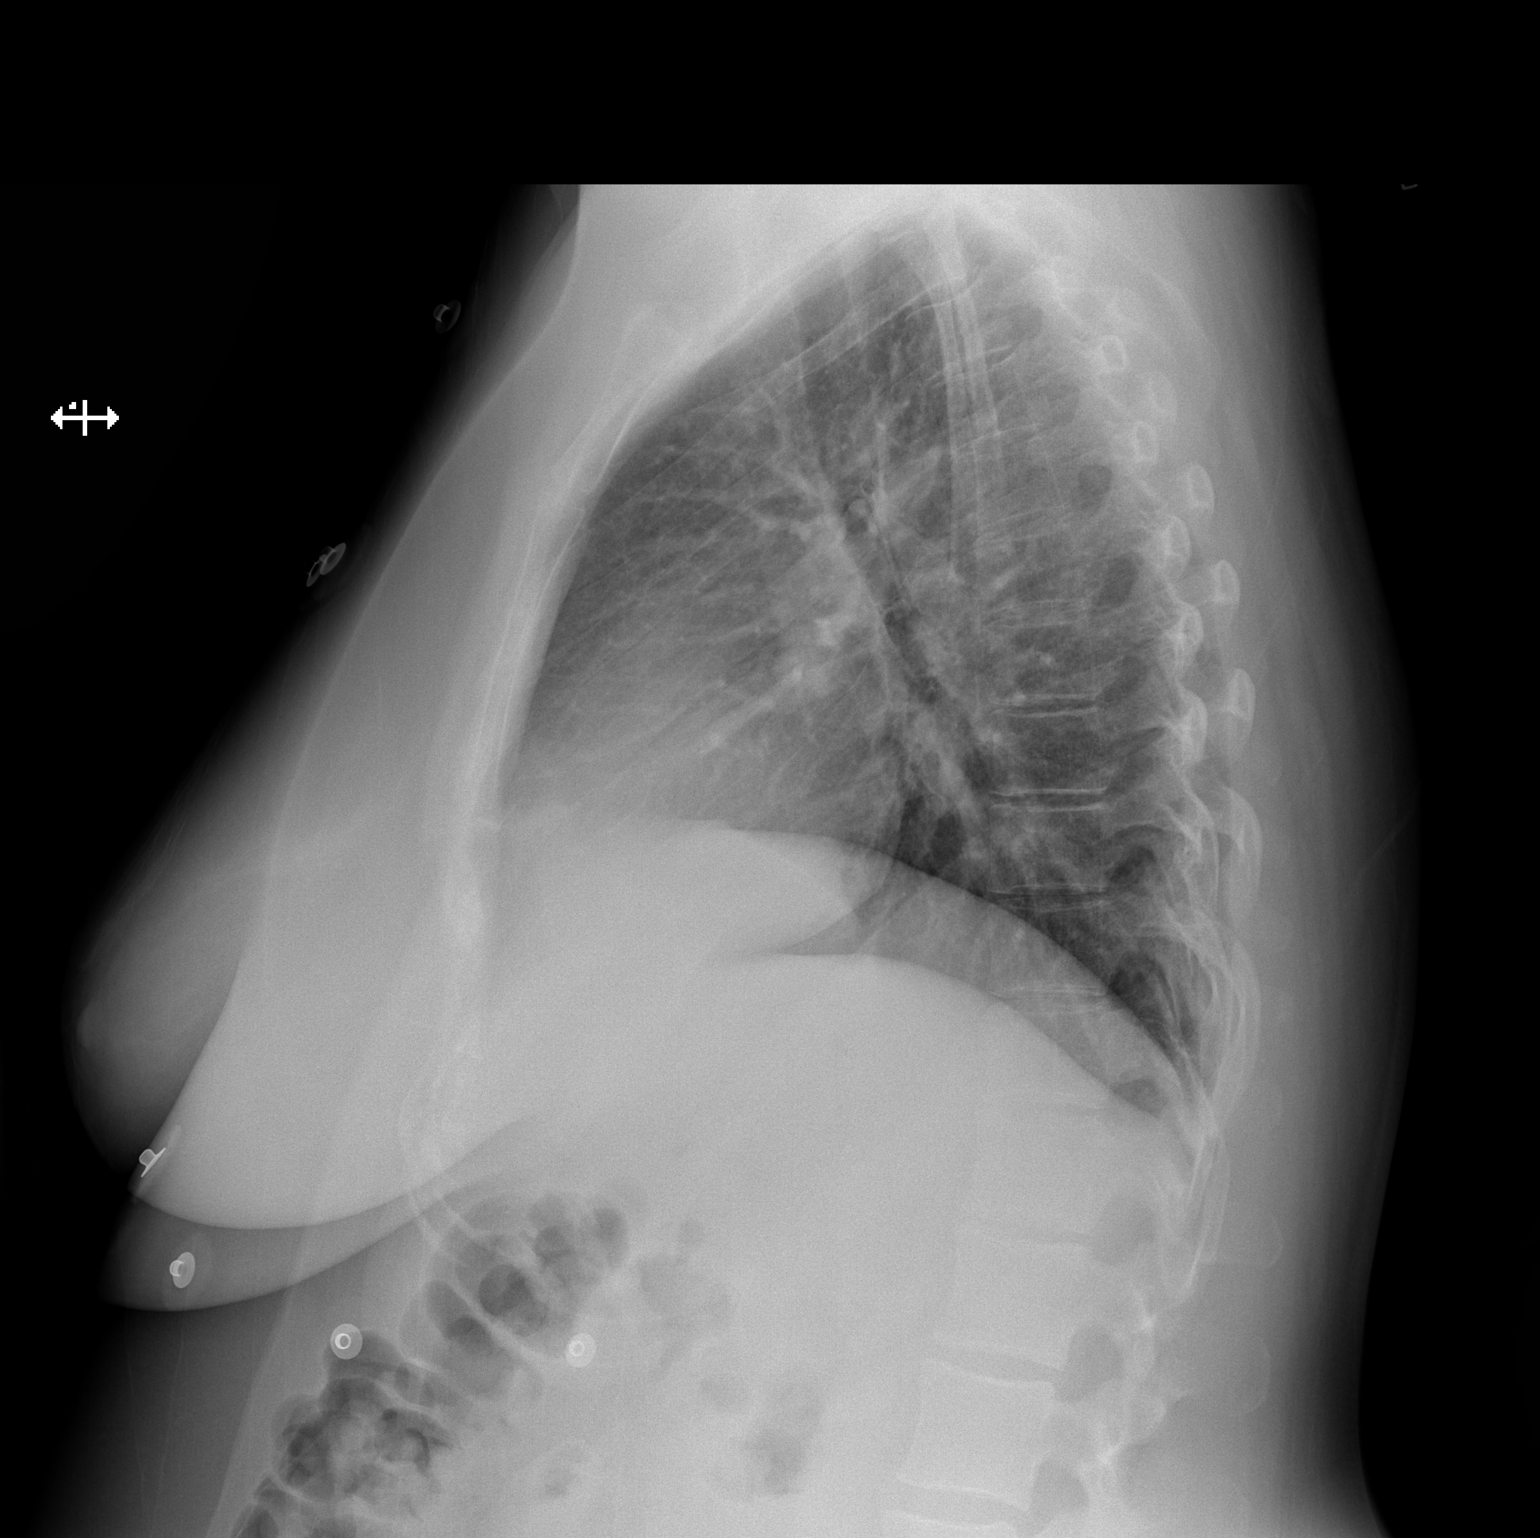

[2 of 2 positions shown; findings below may reference images not displayed]

FINDINGS: Normal heart size and mediastinal contours. No acute infiltrate or
edema. No effusion or pneumothorax. No acute osseous findings.
IMPRESSION: Negative chest.

## 2018-11-11 DIAGNOSIS — M7541 Impingement syndrome of right shoulder: Secondary | ICD-10-CM | POA: Insufficient documentation

## 2019-08-20 ENCOUNTER — Other Ambulatory Visit: Payer: Self-pay

## 2019-08-20 DIAGNOSIS — Z20822 Contact with and (suspected) exposure to covid-19: Secondary | ICD-10-CM

## 2019-08-21 LAB — NOVEL CORONAVIRUS, NAA: SARS-CoV-2, NAA: NOT DETECTED

## 2019-11-11 ENCOUNTER — Other Ambulatory Visit: Payer: Self-pay

## 2019-11-11 DIAGNOSIS — Z20822 Contact with and (suspected) exposure to covid-19: Secondary | ICD-10-CM

## 2019-11-12 LAB — NOVEL CORONAVIRUS, NAA: SARS-CoV-2, NAA: NOT DETECTED

## 2019-11-13 ENCOUNTER — Telehealth: Payer: Self-pay | Admitting: *Deleted

## 2019-11-13 NOTE — Telephone Encounter (Signed)
Patient called given NEGATIVE COVID results .

## 2020-06-14 ENCOUNTER — Ambulatory Visit: Payer: 59

## 2020-06-14 ENCOUNTER — Encounter: Payer: Self-pay | Admitting: Cardiology

## 2020-06-14 ENCOUNTER — Other Ambulatory Visit: Payer: Self-pay

## 2020-06-14 ENCOUNTER — Ambulatory Visit: Payer: 59 | Admitting: Cardiology

## 2020-06-14 VITALS — BP 108/70 | HR 60 | Resp 16 | Ht 63.0 in | Wt 201.0 lb

## 2020-06-14 DIAGNOSIS — R002 Palpitations: Secondary | ICD-10-CM

## 2020-06-14 DIAGNOSIS — R0989 Other specified symptoms and signs involving the circulatory and respiratory systems: Secondary | ICD-10-CM

## 2020-06-14 DIAGNOSIS — R0609 Other forms of dyspnea: Secondary | ICD-10-CM

## 2020-06-14 DIAGNOSIS — R42 Dizziness and giddiness: Secondary | ICD-10-CM

## 2020-06-14 DIAGNOSIS — E6609 Other obesity due to excess calories: Secondary | ICD-10-CM

## 2020-06-14 DIAGNOSIS — R0789 Other chest pain: Secondary | ICD-10-CM

## 2020-06-14 NOTE — Progress Notes (Signed)
Primary Physician/Referring:  Jilda Panda, MD  Patient ID: Jamie [name], female    DOB: 21-Mar-1981, 39 y.o.   MRN: [id number]  Chief Complaint  Patient presents with  . Palpitations  . New Patient (Initial Visit)  . Chest Pain  . Dizziness   HPI:    Jamie [name]  is a 39 y.o. Caucasian female with pregnancy-induced diabetes, presently prediabetes, history of anxiety, previously worked as a Production designer, theatre/television/film but recently quit the job 3 weeks ago to take care of her children as her daughter has been involved with promiscuity, presently 39 years of age.   Over the past 3 weeks she has noticed symptoms of palpitations, feels like her heart races for a few seconds and also has noticed marked dyspnea on exertion.  She also complains of severe sharp left-sided chest pain underneath her left breast.  She has also noticed episodes of sudden onset dizziness to the point where she has also called EMS about a week ago.  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  With diet and continued physical activity, she has continued to lose at least 2 to 3 pounds every week, this has been ongoing for the past 6 months.  She plans to continue to lose weight.  Past Medical History:  Diagnosis Date  . Pregnancy diabetes 2014   Now hyperglycemia only   Past Surgical History:  Procedure Laterality Date  . CESAREAN SECTION    . CESAREAN SECTION WITH BILATERAL TUBAL LIGATION  01/21/2013   Procedure: CESAREAN SECTION WITH BILATERAL TUBAL LIGATION;  Surgeon: Cyril Mourning, MD;  Location: [facility];  Service: Obstetrics;  Laterality: Bilateral;  repeat edc 01/27/13  . DILATION AND CURETTAGE OF UTERUS    . KNEE SURGERY     Family History  Problem Relation Age of Onset  . Hypertension Mother   . Hypertension Father   . Other Neg Hx     Social History   Tobacco Use  . Smoking status: Former Smoker    Packs/day: 0.50    Types: Cigarettes    Quit date: 06/14/2018    Years since quitting: 2.0    . Smokeless tobacco: Never Used  Substance Use Topics  . Alcohol use: Yes    Comment: social   Marital Status: Single  ROS  Review of Systems  Cardiovascular: Positive for chest pain, dyspnea on exertion and palpitations. Negative for leg swelling.  Gastrointestinal: Negative for melena.   Objective  Blood pressure 108/70, pulse 60, resp. rate 16, height 5' 3" (1.6 m), weight 201 lb (91.2 kg), SpO2 98 %, unknown if currently breastfeeding.  Vitals with BMI 06/14/2020 09/21/2015 09/21/2015  Height 5' 3" - -  Weight 201 lbs - -  BMI 77.82 - -  Systolic 423 536 144  Diastolic 70 75 62  Pulse 60 75 79     Physical Exam Constitutional:      Comments: Moderately obese in no acute distress  Cardiovascular:     Rate and Rhythm: Normal rate and regular rhythm.     Pulses: Intact distal pulses.     Heart sounds: Normal heart sounds. No murmur heard.  No gallop.      Comments: No leg edema, no JVD. Pulmonary:     Effort: Pulmonary effort is normal.     Breath sounds: Normal breath sounds.  Chest:     Chest wall: Tenderness (left sided) present.  Abdominal:     General: Bowel sounds are normal.  Palpations: Abdomen is soft.    Laboratory examination:   No results for input(s): NA, K, CL, CO2, GLUCOSE, BUN, CREATININE, CALCIUM, GFRNONAA, GFRAA in the last 8760 hours. CrCl cannot be calculated (Patient's most recent lab result is older than the maximum 21 days allowed.).  CMP Latest Ref Rng & Units 09/21/2015 06/11/2014 01/20/2013  Glucose 65 - 99 mg/dL 117(H) 103(H) 72  BUN 6 - 20 mg/dL <5(L) 8 7  Creatinine 0.44 - 1.00 mg/dL 0.74 0.80 0.53  Sodium 135 - 145 mmol/L 136 137 133(L)  Potassium 3.5 - 5.1 mmol/L 3.6 4.1 4.0  Chloride 101 - 111 mmol/L 101 100 98  CO2 22 - 32 mmol/L 24 19 22  Calcium 8.9 - 10.3 mg/dL 8.8(L) 8.7 8.5  Total Protein 6.5 - 8.1 g/dL 7.4 - -  Total Bilirubin 0.3 - 1.2 mg/dL 0.6 - -  Alkaline Phos 38 - 126 U/L 69 - -  AST 15 - 41 U/L 25 - -  ALT 14 - 54  U/L 23 - -   CBC Latest Ref Rng & Units 09/21/2015 06/11/2014 01/22/2013  WBC 4.0 - 10.5 K/uL 9.3 12.7(H) 10.1  Hemoglobin 12.0 - 15.0 g/dL 13.1 11.9(L) 9.9(L)  Hematocrit 36 - 46 % 38.4 35.6(L) 30.0(L)  Platelets 150 - 400 K/uL 245 227 154    Lipid Panel No results found for: CHOL No results found for: HDL No results found for: LDLCALC No results found for: TRIG No results found for: CHOLHDL No results found for: LDLDIRECT  No results found for: CHOL, TRIG, HDL, CHOLHDL, VLDL, LDLCALC, LDLDIRECT  HEMOGLOBIN A1C No results found for: HGBA1C, MPG TSH No results for input(s): TSH in the last 8760 hours.  External labs:   06/08/2020:  Potassium 4.8, sodium 141, serum glucose 90 mg, creatinine 0.59, EGFR >60 mL.  A1c 6.2%.  TSH normal.  Total cholesterol 196, triglycerides 185, HDL 45, LDL 114.  Hb 13.5/HCT 39.6, platelets 320.  Medications and allergies  No Known Allergies   Current Outpatient Medications  Medication Instructions  . ALPRAZolam (XANAX) 0.5 mg, Oral, 2 times daily PRN   Medications Discontinued During This Encounter  Medication Reason  . nebivolol (BYSTOLIC) 2.5 MG tablet Error  . nebivolol (BYSTOLIC) 10 MG tablet Discontinued by provider  . nebivolol (BYSTOLIC) 10 MG tablet Discontinued by provider    Radiology:   No results found.  Cardiac Studies:   None  EKG  EKG 06/14/2020: Normal sinus rhythm/sinus bradycardia at rate of 54 bpm, normal axis.  Poor R wave progression, probably normal variant.  No evidence of ischemia, normal EKG    Assessment     ICD-10-CM   1. Palpitations  R00.2 EKG 12-Lead    CARDIAC EVENT MONITOR    CANCELED: LONG TERM MONITOR-LIVE TELEMETRY (3-14 DAYS)  2. Dyspnea on exertion  R06.00 PCV ECHOCARDIOGRAM COMPLETE    PCV CARDIAC STRESS TEST  3. Musculoskeletal chest pain  R07.89   4. Dizziness  R42 CARDIAC EVENT MONITOR  5. Labile hypertension  R09.89   6. Class 2 obesity due to excess calories without serious  comorbidity with body mass index (BMI) of 35.0 to 35.9 in adult  E66.09    Z68.35      Recommendations:   Jamie [name]  is a 38 y.o. 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   Recently patient has noticed her blood pressure to be elevated at times.  He was started on Bystolic for palpitations by her PCP recently.  Patient's chest pain is clearly reproducible and musculoskeletal.  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  Also she is on extremes of stress with relation to her daughter.  Symptoms of palpitations, dizziness are severe, due to dyspnea on exertion, labile hypertension, I recommended an echocardiogram and also routine treadmill exercise stress test.  I will also perform an event monitor for 2 weeks and I would like to see her back in 3 to 4 weeks for follow-up.  I will discontinue Bystolic for now.  She has other multiple somatic complaints lightening and numbness in her hands, these appear to be very nonspecific.  I have reassured her.   Adrian Prows, MD, [facility] 06/14/2020, 1:03 PM Office: [phone]

## 2020-06-22 ENCOUNTER — Ambulatory Visit: Payer: Self-pay | Admitting: Cardiology

## 2020-07-12 ENCOUNTER — Ambulatory Visit: Payer: 59

## 2020-07-12 ENCOUNTER — Other Ambulatory Visit: Payer: Self-pay

## 2020-07-12 DIAGNOSIS — R0609 Other forms of dyspnea: Secondary | ICD-10-CM

## 2020-07-19 ENCOUNTER — Encounter: Payer: Self-pay | Admitting: Cardiology

## 2020-07-19 ENCOUNTER — Ambulatory Visit: Payer: 59 | Admitting: Cardiology

## 2020-07-19 ENCOUNTER — Other Ambulatory Visit: Payer: Self-pay

## 2020-07-19 VITALS — BP 112/67 | HR 89 | Resp 16 | Ht 63.0 in | Wt 207.0 lb

## 2020-07-19 DIAGNOSIS — R002 Palpitations: Secondary | ICD-10-CM

## 2020-07-19 DIAGNOSIS — R55 Syncope and collapse: Secondary | ICD-10-CM

## 2020-07-19 NOTE — Progress Notes (Signed)
Primary Physician/Referring:  Jamie Panda, MD  Patient ID: Jamie [name], female    DOB: 1981-02-12, 39 y.o.   MRN: [id number]  Chief Complaint  Patient presents with  . Follow-up    4 week  . Palpitations  . Chest Pain  . Shortness of Breath   HPI:    Jamie [name]  is a 39 y.o. 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   Patient underwent GXT, echocardiogram and event monitor and presents here for follow-up and evaluation of near syncope, palpitations. Symptoms have improved since last office visit. She has not had any recurrence of chest pain.  With diet and continued physical activity, she has continued to lose at least 2 to 3 pounds every week, this has been ongoing for the past 6 months.  She plans to continue to lose weight.  Past Medical History:  Diagnosis Date  . Pregnancy diabetes 2014   Now hyperglycemia only   Past Surgical History:  Procedure Laterality Date  . CESAREAN SECTION    . CESAREAN SECTION WITH BILATERAL TUBAL LIGATION  01/21/2013   Procedure: CESAREAN SECTION WITH BILATERAL TUBAL LIGATION;  Surgeon: Cyril Mourning, MD;  Location: [facility];  Service: Obstetrics;  Laterality: Bilateral;  repeat edc 01/27/13  . DILATION AND CURETTAGE OF UTERUS    . KNEE SURGERY     Family History  Problem Relation Age of Onset  . Hypertension Mother   . Hypertension Father   . Other Neg Hx     Social History   Tobacco Use  . Smoking status: Former Smoker    Packs/day: 0.50    Types: Cigarettes    Quit date: 06/14/2018    Years since quitting: 2.0  . Smokeless tobacco: Never Used  Substance Use Topics  . Alcohol use: Yes    Comment: social   Marital Status: Single  ROS  Review of Systems  Cardiovascular: Positive for palpitations. Negative for chest pain, dyspnea  on exertion and leg swelling.  Gastrointestinal: Negative for melena.   Objective  Blood pressure 112/67, pulse 89, resp. rate 16, height '5\' 3"'  (1.6 m), weight (!) 207 lb (93.9 kg), SpO2 95 %, unknown if currently breastfeeding.  Vitals with BMI 07/19/2020 06/14/2020 09/21/2015  Height '5\' 3"'  '5\' 3"'  -  Weight 207 lbs 201 lbs -  BMI 99.35 70.17 -  Systolic 793 903 009  Diastolic 67 70 75  Pulse 89 60 75     Physical Exam Constitutional:      Comments: Moderately obese in no acute distress  Cardiovascular:     Rate and Rhythm: Normal rate and regular rhythm.     Pulses: Intact distal pulses.     Heart sounds: Normal heart sounds. No murmur heard.  No gallop.      Comments: No leg edema, no JVD. Pulmonary:     Effort: Pulmonary effort is normal.     Breath sounds: Normal breath sounds.  Chest:     Chest wall: No tenderness.  Abdominal:     General: Bowel sounds are normal.     Palpations: Abdomen is soft.    Laboratory examination:   No results for input(s): NA, K, CL, CO2, GLUCOSE, BUN, CREATININE, CALCIUM, GFRNONAA, GFRAA in the last 8760 hours. CrCl cannot be calculated (Patient's most recent lab result is older than the  maximum 21 days allowed.).  CMP Latest Ref Rng & Units 09/21/2015 06/11/2014 01/20/2013  Glucose 65 - 99 mg/dL 117(H) 103(H) 72  BUN 6 - 20 mg/dL <5(L) 8 7  Creatinine 0.44 - 1.00 mg/dL 0.74 0.80 0.53  Sodium 135 - 145 mmol/L 136 137 133(L)  Potassium 3.5 - 5.1 mmol/L 3.6 4.1 4.0  Chloride 101 - 111 mmol/L 101 100 98  CO2 22 - 32 mmol/L '24 19 22  ' Calcium 8.9 - 10.3 mg/dL 8.8(L) 8.7 8.5  Total Protein 6.5 - 8.1 g/dL 7.4 - -  Total Bilirubin 0.3 - 1.2 mg/dL 0.6 - -  Alkaline Phos 38 - 126 U/L 69 - -  AST 15 - 41 U/L 25 - -  ALT 14 - 54 U/L 23 - -   CBC Latest Ref Rng & Units 09/21/2015 06/11/2014 01/22/2013  WBC 4.0 - 10.5 K/uL 9.3 12.7(H) 10.1  Hemoglobin 12.0 - 15.0 g/dL 13.1 11.9(L) 9.9(L)  Hematocrit 36 - 46 % 38.4 35.6(L) 30.0(L)  Platelets 150 - 400  K/uL 245 227 154   External labs:   06/08/2020:  Potassium 4.8, sodium 141, serum glucose 90 mg, creatinine 0.59, EGFR >60 mL.  A1c 6.2%.  TSH normal.  Total cholesterol 196, triglycerides 185, HDL 45, LDL 114.  Hb 13.5/HCT 39.6, platelets 320.  Medications and allergies  No Known Allergies   Current Outpatient Medications  Medication Instructions  . ALPRAZolam (XANAX) 0.5 mg, Oral, 2 times daily PRN   There are no discontinued medications.  Radiology:   No results found.  Cardiac Studies:   Event Monitor for 14 days Start date 06/14/2020 - 06/27/2020 : Predominant rhythm is normal sinus rhythm.  There was no patient activated events.  Rare PACs and PVCs.  No atrial fibrillation, no SVT, no heart block.   Exercise treadmill stress test 07/12/2020: Exercise treadmill stress test performed using Bruce protocol.  Patient reached 10.7 METS, and 91% of age predicted maximum heart rate.  Exercise capacity was excellent.  No chest pain reported.  Normal heart rate and hemodynamic response. Stress EKG revealed no ischemic changes. Low risk study.  Echocardiogram 07/12/2020:  Left ventricle cavity is normal in size and wall thickness. Normal LV  systolic function with EF 55%. Normal global wall motion. Normal diastolic  filling pattern.  Left atrial cavity is mildly dilated.  Mild (Grade I) mitral regurgitation.  Mild tricuspid regurgitation.  No evidence of pulmonary hypertension.    EKG  EKG 06/14/2020: Normal sinus rhythm/sinus bradycardia at rate of 54 bpm, normal axis.  Poor R wave progression, probably normal variant.  No evidence of ischemia, normal EKG    Assessment     ICD-10-CM   1. Vasovagal near syncope  R55   2. Palpitations  R00.2     Recommendations:   Jamie [name]  is a 39 y.o. Caucasian female with pregnancy-induced diabetes, presently prediabetes, history of anxiety, previously worked as a Production designer, theatre/television/film but recently quit about 2 months ago to take  care of her children as her daughter has been involved with promiscuity, presently 38 years of age.   Patient's symptoms of palpitations, dizziness and near syncope are clearly suggestive of vasovagal near syncope. Advised her to keep her self well-hydrated, she will also try B6 and B12 supplements to see when her symptoms would get better.  Counterpressure maneuver discussed with the patient. I also reviewed her echocardiogram and event monitor with patient and stress test. All within normal limits. Obesity discussed, weight loss discussed  with the patient. I will see her back on a as needed basis.    Adrian Prows, MD, [facility] 07/19/2020, 3:44 PM Office: [phone]

## 2020-07-19 NOTE — Patient Instructions (Signed)
I will emperically try Vit B1 (Thiamine) 50 mg, Vit B6 (Pyrodoxine) 50 mg and Vit B12 (rappid release) ,  Valsalva strain:  explained Valsalva maneuver, straining, for vasovagal episodes.   Leg rising. Laying down will help.  Keep good hydration always.

## 2020-09-30 ENCOUNTER — Other Ambulatory Visit: Payer: Self-pay

## 2020-09-30 ENCOUNTER — Ambulatory Visit
Admission: RE | Admit: 2020-09-30 | Discharge: 2020-09-30 | Disposition: A | Discharge status: Home / Self Care | Payer: Self-pay | Source: Ambulatory Visit | Attending: Internal Medicine | Admitting: Internal Medicine

## 2020-09-30 DIAGNOSIS — R52 Pain, unspecified: Secondary | ICD-10-CM

## 2020-09-30 DIAGNOSIS — R609 Edema, unspecified: Secondary | ICD-10-CM

## 2020-12-18 ENCOUNTER — Emergency Department (HOSPITAL_COMMUNITY)
Admission: EM | Admit: 2020-12-18 | Discharge: 2020-12-18 | Disposition: A | Discharge status: Home / Self Care | Payer: 59 | Attending: Emergency Medicine | Admitting: Emergency Medicine

## 2020-12-18 ENCOUNTER — Encounter (HOSPITAL_COMMUNITY): Payer: Self-pay

## 2020-12-18 ENCOUNTER — Other Ambulatory Visit: Payer: Self-pay

## 2020-12-18 DIAGNOSIS — X19XXXA Contact with other heat and hot substances, initial encounter: Secondary | ICD-10-CM | POA: Insufficient documentation

## 2020-12-18 DIAGNOSIS — Y9301 Activity, walking, marching and hiking: Secondary | ICD-10-CM | POA: Diagnosis not present

## 2020-12-18 DIAGNOSIS — S91112A Laceration without foreign body of left great toe without damage to nail, initial encounter: Secondary | ICD-10-CM | POA: Insufficient documentation

## 2020-12-18 DIAGNOSIS — S99922A Unspecified injury of left foot, initial encounter: Secondary | ICD-10-CM | POA: Diagnosis present

## 2020-12-18 DIAGNOSIS — Z87891 Personal history of nicotine dependence: Secondary | ICD-10-CM | POA: Diagnosis not present

## 2020-12-18 DIAGNOSIS — Z23 Encounter for immunization: Secondary | ICD-10-CM | POA: Diagnosis not present

## 2020-12-18 MED ORDER — TETANUS-DIPHTH-ACELL PERTUSSIS 5-2.5-18.5 LF-MCG/0.5 IM SUSY
0.5000 mL | PREFILLED_SYRINGE | Freq: Once | INTRAMUSCULAR | Status: AC
  Administered 2020-12-18: 0.5 mL via INTRAMUSCULAR
  Filled 2020-12-18: qty 0.5

## 2020-12-18 NOTE — Discharge Instructions (Signed)
I have repair your wound with dermabond, this will likely come off within the next 5-10 days. Keep this area clean and dry.  Your tetanus was updated on today's visit.  If you experience any redness, pus drainage or fever please return to the ED.

## 2020-12-18 NOTE — ED Provider Notes (Signed)
[facility] EMERGENCY DEPARTMENT Provider Note   CSN: [id number] Arrival date & time: 12/18/20  1105     History Chief Complaint  Patient presents with  . Laceration    Jamie [name] is a 39 y.o. female.  39 y.o female with no PMH presents to the ED status post left foot injury.  Patient reports waking up this morning, ran into a door, thinks she likely caught her foot on heater.  She reports no significant bleeding at the time.  There is pain with palpation along with ambulation.  Has not taken any medication for improvement in her symptoms.  Her last tetanus immunization is unknown.  The history is provided by the patient.  Laceration Location:  Foot Foot laceration location:  L toes Length:  1.0 Depth:  Cutaneous Quality: stellate   Bleeding: venous   Time since incident:  2 hours Laceration mechanism:  Unable to specify Foreign body present:  No foreign bodies Worsened by:  Nothing Tetanus status:  Unknown Associated symptoms: no fever        Past Medical History:  Diagnosis Date  . Pregnancy diabetes 2014   Now hyperglycemia only    There are no problems to display for this patient.   Past Surgical History:  Procedure Laterality Date  . CESAREAN SECTION    . CESAREAN SECTION WITH BILATERAL TUBAL LIGATION  01/21/2013   Procedure: CESAREAN SECTION WITH BILATERAL TUBAL LIGATION;  Surgeon: Jeani Hawking, MD;  Location: [facility];  Service: Obstetrics;  Laterality: Bilateral;  repeat edc 01/27/13  . DILATION AND CURETTAGE OF UTERUS    . KNEE SURGERY       OB History    Gravida  7   Para  1   Term  1   Preterm      AB  4   Living  3     SAB  2   IAB  2   Ectopic      Multiple      Live Births  1           Family History  Problem Relation Age of Onset  . Hypertension Mother   . Hypertension Father   . Other Neg Hx     Social History   Tobacco Use  . Smoking status: Former Smoker    Packs/day: 0.50    Types:  Cigarettes    Quit date: 06/14/2018    Years since quitting: 2.5  . Smokeless tobacco: Never Used  Vaping Use  . Vaping Use: Former  Substance Use Topics  . Alcohol use: Yes    Comment: social  . Drug use: No    Home Medications Prior to Admission medications   Medication Sig Start Date End Date Taking? Authorizing Provider  ALPRAZolam Prudy Feeler) 0.5 MG tablet Take 0.5 mg by mouth 2 (two) times daily as needed for anxiety.    [provider]    Allergies    Patient has no known allergies.  Review of Systems   Review of Systems  Constitutional: Negative for fever.  Skin: Positive for wound.    Physical Exam Updated Vital Signs BP 132/89 (BP Location: Right Arm)   Pulse 82   Temp 98.3 F (36.8 C) (Oral)   Resp 16   Ht 5\' 3"  (1.6 m)   Wt 88 kg   SpO2 99%   BMI 34.37 kg/m   Physical Exam Vitals and nursing note reviewed.  Constitutional:  Appearance: Normal appearance.  HENT:     Head: Normocephalic and atraumatic.     Nose: Nose normal.  Cardiovascular:     Rate and Rhythm: Normal rate.     Pulses:          Dorsalis pedis pulses are 2+ on the left side.       Posterior tibial pulses are 2+ on the left side.  Pulmonary:     Effort: Pulmonary effort is normal.  Abdominal:     General: Abdomen is flat.  Musculoskeletal:     Cervical back: Normal range of motion and neck supple.  Feet:     Left foot:     Skin integrity: Erythema present. No skin breakdown or callus.     Toenail Condition: Left toenails are normal.     Comments: Small 1.0 cm laceration to the left great toe, superficial in nature.  Skin:    General: Skin is warm and dry.  Neurological:     Mental Status: She is alert and oriented to person, place, and time.     ED Results / Procedures / Treatments   Labs (all labs ordered are listed, but only abnormal results are displayed) Labs Reviewed - No data to display  EKG None  Radiology No results  found.  Procedures .Marland KitchenLaceration Repair  Date/Time: 12/18/2020 12:20 PM Performed by: Claude Manges, PA-C Authorized by: Claude Manges, PA-C   Consent:    Consent obtained:  Verbal   Consent given by:  Patient   Risks discussed:  Infection and poor cosmetic result Universal protocol:    Patient identity confirmed:  Verbally with patient Anesthesia:    Anesthesia method:  None Laceration details:    Location:  Toe   Toe location:  L big toe   Length (cm):  1 Exploration:    Hemostasis achieved with:  Direct pressure Treatment:    Area cleansed with:  Saline   Amount of cleaning:  Extensive   Irrigation method:  Pressure wash Skin repair:    Repair method:  Tissue adhesive Approximation:    Approximation:  Close Repair type:    Repair type:  Simple Post-procedure details:    Dressing:  Open (no dressing)   Procedure completion:  Tolerated well, no immediate complications   (including critical care time)  Medications Ordered in ED Medications  Tdap (BOOSTRIX) injection 0.5 mL (0.5 mLs Intramuscular Given 12/18/20 1210)    ED Course  I have reviewed the triage vital signs and the nursing notes.  Pertinent labs & imaging results that were available during my care of the patient were reviewed by me and considered in my medical decision making (see chart for details).    MDM Rules/Calculators/A&P    Patient with no pertinent past medical history presents to the ED with a chief complaint of left great toe laceration.  Patient attempted to ambulate this morning after waking up, reports "unblinded tobacco, did not see where she stepped on, which she believes is likely uterine.  There is pain to the area, minimal bleeding at the time.  Last tetanus immunization is unknown.  During evaluation laceration appears superficial in nature, bleeding is controlled at the time.  It was irrigated along with debridement by me. Discussed risks and benefits of sutures versus dermabond.  Wound appears superficial in nature, we discussed repair with dermabond. Will update tetanus as well.  Laceration repaired Via Dermabond.  Patient tolerated the procedure well.  Return precautions discussed at length.    Portions  of this note were generated with Scientist, clinical (histocompatibility and immunogenetics). Dictation errors may occur despite best attempts at proofreading.  Final Clinical Impression(s) / ED Diagnoses Final diagnoses:  Laceration of left great toe without foreign body present or damage to nail, initial encounter    Rx / DC Orders ED Discharge Orders    None       Claude Manges, Cordelia Poche 12/18/20 1221    Tilden Fossa, MD 12/18/20 1348

## 2020-12-18 NOTE — ED Notes (Signed)
Pt discharge instructions reviewed with the patient. The patient verbalized understanding of instructions. Pt discharged. 

## 2020-12-18 NOTE — ED Triage Notes (Signed)
Pt has small laceration to the bottom of her left big toe, she cut it on a space heater this morning, bleeding controlled. Last tetanus unknown

## 2020-12-30 DIAGNOSIS — G5601 Carpal tunnel syndrome, right upper limb: Secondary | ICD-10-CM | POA: Insufficient documentation

## 2021-10-12 ENCOUNTER — Other Ambulatory Visit: Payer: Self-pay | Admitting: Internal Medicine

## 2021-10-12 DIAGNOSIS — N3090 Cystitis, unspecified without hematuria: Secondary | ICD-10-CM

## 2021-10-12 DIAGNOSIS — R1032 Left lower quadrant pain: Secondary | ICD-10-CM

## 2021-10-17 ENCOUNTER — Encounter (HOSPITAL_COMMUNITY): Payer: Self-pay

## 2021-10-17 ENCOUNTER — Other Ambulatory Visit: Payer: Self-pay

## 2021-10-17 ENCOUNTER — Emergency Department (HOSPITAL_COMMUNITY)
Admission: EM | Admit: 2021-10-17 | Discharge: 2021-10-18 | Disposition: A | Discharge status: Home / Self Care | Payer: 59 | Attending: Emergency Medicine | Admitting: Emergency Medicine

## 2021-10-17 DIAGNOSIS — Z87891 Personal history of nicotine dependence: Secondary | ICD-10-CM | POA: Diagnosis not present

## 2021-10-17 DIAGNOSIS — K5790 Diverticulosis of intestine, part unspecified, without perforation or abscess without bleeding: Secondary | ICD-10-CM

## 2021-10-17 DIAGNOSIS — K5792 Diverticulitis of intestine, part unspecified, without perforation or abscess without bleeding: Secondary | ICD-10-CM | POA: Diagnosis not present

## 2021-10-17 DIAGNOSIS — R1032 Left lower quadrant pain: Secondary | ICD-10-CM

## 2021-10-17 LAB — CBC WITH DIFFERENTIAL/PLATELET
Abs Immature Granulocytes: 0.01 10*3/uL (ref 0.00–0.07)
Basophils Absolute: 0 10*3/uL (ref 0.0–0.1)
Basophils Relative: 1 %
Eosinophils Absolute: 0.2 10*3/uL (ref 0.0–0.5)
Eosinophils Relative: 3 %
HCT: 38.8 % (ref 36.0–46.0)
Hemoglobin: 12.8 g/dL (ref 12.0–15.0)
Immature Granulocytes: 0 %
Lymphocytes Relative: 32 %
Lymphs Abs: 2.6 10*3/uL (ref 0.7–4.0)
MCH: 29.2 pg (ref 26.0–34.0)
MCHC: 33 g/dL (ref 30.0–36.0)
MCV: 88.6 fL (ref 80.0–100.0)
Monocytes Absolute: 0.5 10*3/uL (ref 0.1–1.0)
Monocytes Relative: 6 %
Neutro Abs: 4.9 10*3/uL (ref 1.7–7.7)
Neutrophils Relative %: 58 %
Platelets: 302 10*3/uL (ref 150–400)
RBC: 4.38 MIL/uL (ref 3.87–5.11)
RDW: 14.1 % (ref 11.5–15.5)
WBC: 8.3 10*3/uL (ref 4.0–10.5)
nRBC: 0 % (ref 0.0–0.2)

## 2021-10-17 LAB — COMPREHENSIVE METABOLIC PANEL
ALT: 20 U/L (ref 0–44)
AST: 21 U/L (ref 15–41)
Albumin: 4.1 g/dL (ref 3.5–5.0)
Alkaline Phosphatase: 64 U/L (ref 38–126)
Anion gap: 8 (ref 5–15)
BUN: 11 mg/dL (ref 6–20)
CO2: 27 mmol/L (ref 22–32)
Calcium: 9.2 mg/dL (ref 8.9–10.3)
Chloride: 102 mmol/L (ref 98–111)
Creatinine, Ser: 0.61 mg/dL (ref 0.44–1.00)
GFR, Estimated: >60 mL/min (ref >60)
Glucose, Bld: 129 mg/dL — ABNORMAL HIGH (ref 70–99)
Potassium: 4 mmol/L (ref 3.5–5.1)
Sodium: 137 mmol/L (ref 135–145)
Total Bilirubin: 0.4 mg/dL (ref 0.3–1.2)
Total Protein: 7.5 g/dL (ref 6.5–8.1)

## 2021-10-17 LAB — LIPASE, BLOOD: Lipase: 44 U/L (ref 11–51)

## 2021-10-17 NOTE — ED Triage Notes (Signed)
Pt reports left sided abdominal pain since 10/14. Pt reports being seen prior to same and has CT scheduled on 11/3. Pt denies N/V/D and rectal bleeding.

## 2021-10-17 NOTE — ED Provider Notes (Signed)
Emergency Medicine Provider Triage Evaluation Note  Jamie [name] , a 40 y.o. female  was evaluated in triage.  Pt complains of left-sided flank pain that radiates to the umbilicus x10 days.  Patient evaluated by PCP and has a CT abdomen scheduled for 11/3.  Patient states pain is worse when going from sitting to standing position.  No previous kidney stones.  Denies urinary and vaginal symptoms.  No fever or chills.  Admits to nausea however, no vomiting or diarrhea.  Review of Systems  Positive: Flank/abdominal pain Negative: fever  Physical Exam  There were no vitals taken for this visit. Gen:   Awake, no distress   Resp:  Normal effort  MSK:   Moves extremities without difficulty  Other:    Medical Decision Making  Medically screening exam initiated at 3:34 PM.  Appropriate orders placed.  Alvan Dame Mos[place] was informed that the remainder of the evaluation will be completed by another provider, this initial triage assessment does not replace that evaluation, and the importance of remaining in the ED until their evaluation is complete.  Abdominal labs CT abdomen   Mannie Stabile, PA-C 10/17/21 1535    [place], [place], [place] 10/19/21 [phone]

## 2021-10-18 ENCOUNTER — Emergency Department (HOSPITAL_COMMUNITY): Payer: 59

## 2021-10-18 LAB — URINALYSIS, ROUTINE W REFLEX MICROSCOPIC
Bilirubin Urine: NEGATIVE
Glucose, UA: NEGATIVE mg/dL
Ketones, ur: NEGATIVE mg/dL
Leukocytes,Ua: NEGATIVE
Nitrite: NEGATIVE
Protein, ur: NEGATIVE mg/dL
Specific Gravity, Urine: 1.012 (ref 1.005–1.030)
pH: 6 (ref 5.0–8.0)

## 2021-10-18 LAB — PREGNANCY, URINE: Preg Test, Ur: NEGATIVE

## 2021-10-18 MED ORDER — CIPROFLOXACIN HCL 500 MG PO TABS: 500.0000 mg | ORAL_TABLET | Freq: Two times a day (BID) | ORAL | 0 refills | Status: AC

## 2021-10-18 MED ORDER — CIPROFLOXACIN HCL 500 MG PO TABS
500.0000 mg | ORAL_TABLET | Freq: Once | ORAL | Status: AC
  Administered 2021-10-18: 500 mg via ORAL
  Filled 2021-10-18: qty 1

## 2021-10-18 MED ORDER — IOHEXOL 350 MG/ML SOLN
80.0000 mL | Freq: Once | INTRAVENOUS | Status: AC | PRN
  Administered 2021-10-18: 80 mL via INTRAVENOUS

## 2021-10-18 MED ORDER — METRONIDAZOLE 500 MG PO TABS
500.0000 mg | ORAL_TABLET | Freq: Once | ORAL | Status: AC
  Administered 2021-10-18: 500 mg via ORAL
  Filled 2021-10-18: qty 1

## 2021-10-18 MED ORDER — METRONIDAZOLE 500 MG PO TABS: 500.0000 mg | ORAL_TABLET | Freq: Two times a day (BID) | ORAL | 0 refills | Status: DC

## 2021-10-18 NOTE — ED Notes (Signed)
Pt states she's been having this abdominal pain for several weeks and has a CT scheduled for Nov 3rd, but hasn't slept and she's been uncomfortable so her Dr told her to come here to be evaluated She describes it as a dull pain that radiates through to her [employer]

## 2021-10-18 NOTE — ED Notes (Signed)
236 ml of water

## 2021-10-18 NOTE — Discharge Instructions (Signed)
1. Medications: cipro and flagyl, usual home medications 2. Treatment: rest, drink plenty of fluids, advance diet slowly 3. Follow Up: Please followup with your primary doctor in 2 days for discussion of your diagnoses and further evaluation after today's visit; if you do not have a primary care doctor use the resource guide provided to find one; Please return to the ER for persistent vomiting, high fevers or worsening symptoms

## 2021-10-18 NOTE — ED Provider Notes (Signed)
[facility]-EMERGENCY DEPT Provider Note   CSN: [id number] Arrival date & time: 10/17/21  1427     History Chief Complaint  Patient presents with   Abdominal Pain    Jamie [name] is a 40 y.o. female presents to the ED with c/o LLQ abd pain intermittent since 10/07/21.  Pt reports the pain is sharp in nature and has been constant for the last week. Pt reports pain is worse when sitting.  Aggravated by pulling her left leg to the abd.  Hx of c-section x2 and ventral hernia that has never given her any trouble. Pt reports normal BMs.  Denies hematochezia or melena. Pain has been progressively worsening.  Now with decreased appetite.  Pt reports the left side of abd feels swollen as well. No treatments PTA. No oral intake since 12pm yesterday.   The history is provided by the patient and medical records. No language interpreter was used.      Past Medical History:  Diagnosis Date   Pregnancy diabetes 2014   Now hyperglycemia only    There are no problems to display for this patient.   Past Surgical History:  Procedure Laterality Date   CESAREAN SECTION     CESAREAN SECTION WITH BILATERAL TUBAL LIGATION  01/21/2013   Procedure: CESAREAN SECTION WITH BILATERAL TUBAL LIGATION;  Surgeon: Jeani Hawking, MD;  Location: [facility];  Service: Obstetrics;  Laterality: Bilateral;  repeat edc 01/27/13   DILATION AND CURETTAGE OF UTERUS     KNEE SURGERY       OB History     Gravida  7   Para  1   Term  1   Preterm      AB  4   Living  3      SAB  2   IAB  2   Ectopic      Multiple      Live Births  1           Family History  Problem Relation Age of Onset   Hypertension Mother    Hypertension Father    Other Neg Hx     Social History   Tobacco Use   Smoking status: Former    Packs/day: 0.50    Types: Cigarettes    Quit date: 06/14/2018    Years since quitting: 3.3   Smokeless tobacco: Never  Vaping Use   Vaping Use: Former   Substance Use Topics   Alcohol use: Yes    Comment: social   Drug use: No    Home Medications Prior to Admission medications   Medication Sig Start Date End Date Taking? Authorizing Provider  ciprofloxacin (CIPRO) 500 MG tablet Take 1 tablet (500 mg total) by mouth every 12 (twelve) hours for 7 days. 10/18/21 10/25/21 Yes Ferrell Flam, Dahlia Client, PA-C  metroNIDAZOLE (FLAGYL) 500 MG tablet Take 1 tablet (500 mg total) by mouth 2 (two) times daily. 10/18/21  Yes Landen Breeland, Dahlia Client, PA-C  ALPRAZolam Prudy Feeler) 0.5 MG tablet Take 0.5 mg by mouth 3 (three) times daily as needed for anxiety.    [provider]  cyclobenzaprine (FLEXERIL) 10 MG tablet Take 10 mg by mouth daily as needed for muscle spasms.    [provider]  fluconazole (DIFLUCAN) 150 MG tablet Take 150 mg by mouth once. 12/16/20   [provider]  medroxyPROGESTERone (DEPO-PROVERA) 150 MG/ML injection Inject 150 mg into the muscle every 3 (three) months. 11/29/20   [provider]  Allergies    Patient has no known allergies.  Review of Systems   Review of Systems  Constitutional:  Positive for appetite change. Negative for diaphoresis, fatigue, fever and unexpected weight change.  HENT:  Negative for mouth sores.   Eyes:  Negative for visual disturbance.  Respiratory:  Negative for cough, chest tightness, shortness of breath and wheezing.   Cardiovascular:  Negative for chest pain.  Gastrointestinal:  Positive for abdominal pain and nausea. Negative for blood in stool, constipation, diarrhea and vomiting.  Endocrine: Negative for polydipsia, polyphagia and polyuria.  Genitourinary:  Negative for dysuria, frequency, hematuria and urgency.  Musculoskeletal:  Negative for back pain and neck stiffness.  Skin:  Negative for rash.  Allergic/Immunologic: Negative for immunocompromised state.  Neurological:  Negative for syncope, light-headedness and headaches.  Hematological:  Does not  bruise/bleed easily.  Psychiatric/Behavioral:  Negative for sleep disturbance. The patient is not nervous/anxious.    Physical Exam Updated Vital Signs BP 124/90   Pulse 77   Temp 98.9 F (37.2 C) (Oral)   Resp 16   Ht 5\' 3"  (1.6 m)   Wt 94.8 kg   SpO2 99%   BMI 37.03 kg/m   Physical Exam Vitals and nursing note reviewed.  Constitutional:      General: She is not in acute distress.    Appearance: She is not diaphoretic.  HENT:     Head: Normocephalic.  Eyes:     General: No scleral icterus.    Conjunctiva/sclera: Conjunctivae normal.  Cardiovascular:     Rate and Rhythm: Normal rate and regular rhythm.     Pulses: Normal pulses.          Radial pulses are 2+ on the right side and 2+ on the left side.  Pulmonary:     Effort: No tachypnea, accessory muscle usage, prolonged expiration, respiratory distress or retractions.     Breath sounds: No stridor.     Comments: Equal chest rise. No increased work of breathing. Abdominal:     General: There is no distension.     Palpations: Abdomen is soft.     Tenderness: There is abdominal tenderness. There is guarding. There is no rebound.  Musculoskeletal:     Cervical back: Normal range of motion.     Comments: Moves all extremities equally and without difficulty.  Skin:    General: Skin is warm and dry.     Capillary Refill: Capillary refill takes less than 2 seconds.  Neurological:     Mental Status: She is alert.     GCS: GCS eye subscore is 4. GCS verbal subscore is 5. GCS motor subscore is 6.     Comments: Speech is clear and goal oriented.  Psychiatric:        Mood and Affect: Mood normal.    ED Results / Procedures / Treatments   Labs (all labs ordered are listed, but only abnormal results are displayed) Labs Reviewed  COMPREHENSIVE METABOLIC PANEL - Abnormal; Notable for the following components:      Result Value   Glucose, Bld 129 (*)    All other components within normal limits  URINALYSIS, ROUTINE W  REFLEX MICROSCOPIC - Abnormal; Notable for the following components:   Hgb urine dipstick SMALL (*)    Bacteria, UA MANY (*)    All other components within normal limits  CBC WITH DIFFERENTIAL/PLATELET  LIPASE, BLOOD  PREGNANCY, URINE     Radiology CT ABDOMEN PELVIS W CONTRAST  Result Date: 10/18/2021 CLINICAL  DATA:  Left lower quadrant pain. EXAM: CT ABDOMEN AND PELVIS WITH CONTRAST TECHNIQUE: Multidetector CT imaging of the abdomen and pelvis was performed using the standard protocol following bolus administration of intravenous contrast. CONTRAST:  65mL OMNIPAQUE IOHEXOL 350 MG/ML SOLN COMPARISON:  None. FINDINGS: Lower chest: No acute abnormality. Hepatobiliary: No focal liver abnormality is seen. No gallstones, gallbladder wall thickening, or biliary dilatation. Pancreas: Unremarkable. No pancreatic ductal dilatation or surrounding inflammatory changes. Spleen: Normal in size without focal abnormality. Adrenals/Urinary Tract: Adrenal glands are unremarkable. Kidneys are normal, without renal calculi, focal lesion, or hydronephrosis. Bladder is unremarkable. Stomach/Bowel: Stomach is within normal limits. Appendix appears normal. No evidence of bowel wall thickening, distention, or inflammatory changes. Noninflamed diverticula are seen within the proximal sigmoid colon. Vascular/Lymphatic: No significant vascular findings are present. No enlarged abdominal or pelvic lymph nodes. Reproductive: Uterus and bilateral adnexa are unremarkable. Other: No abdominal wall hernia or abnormality. No abdominopelvic ascites. Musculoskeletal: No acute or significant osseous findings. IMPRESSION: Sigmoid diverticulosis. Electronically Signed   By: Aram Candela M.D.   On: 10/18/2021 03:58    Procedures Procedures   Medications Ordered in ED Medications  iohexol (OMNIPAQUE) 350 MG/ML injection 80 mL (80 mLs Intravenous Contrast Given 10/18/21 0340)  ciprofloxacin (CIPRO) tablet 500 mg (500 mg Oral Given  10/18/21 0442)  metroNIDAZOLE (FLAGYL) tablet 500 mg (500 mg Oral Given 10/18/21 0443)    ED Course  I have reviewed the triage vital signs and the nursing notes.  Pertinent labs & imaging results that were available during my care of the patient were reviewed by me and considered in my medical decision making (see chart for details).    MDM Rules/Calculators/A&P                           Presents with approximately 10 days of left lower quadrant abdominal pain worsening.  Denies fevers or chills.  Labs reassuring.  Afebrile here emergency department.  No tachycardia or signs of sepsis.  CT scan shows diverticulosis.  Patient reports no history of same.  Given her ongoing pain I suspect some element of diverticulitis.  Discussed with patient lack of imaging to confirm this however given symptoms we will treat with Cipro and Flagyl.  Patient is to have close primary care follow-up.  She states understanding and is in agreement with the plan.   Final Clinical Impression(s) / ED Diagnoses Final diagnoses:  Left lower quadrant abdominal pain  Diverticulosis    Rx / DC Orders ED Discharge Orders          Ordered    ciprofloxacin (CIPRO) 500 MG tablet  Every 12 hours        10/18/21 0435    metroNIDAZOLE (FLAGYL) 500 MG tablet  2 times daily        10/18/21 0435             Milinda Sweeney, Dahlia Client, PA-C 10/18/21 0503    Dione Booze, MD 10/18/21 [phone]

## 2021-10-27 ENCOUNTER — Other Ambulatory Visit: Payer: 59

## 2021-11-18 IMAGING — CR DG ANKLE COMPLETE 3+V*R*
3 series · 3 of 3 positions shown · non-contrast
Comparison: None.

CLINICAL DATA: Two injuries to the right ankle 09/25/20 and 09/30/20
after fall, pain and swelling laterally

EXAM:
RIGHT ANKLE - COMPLETE 3+ VIEW

[x ankle ap right]
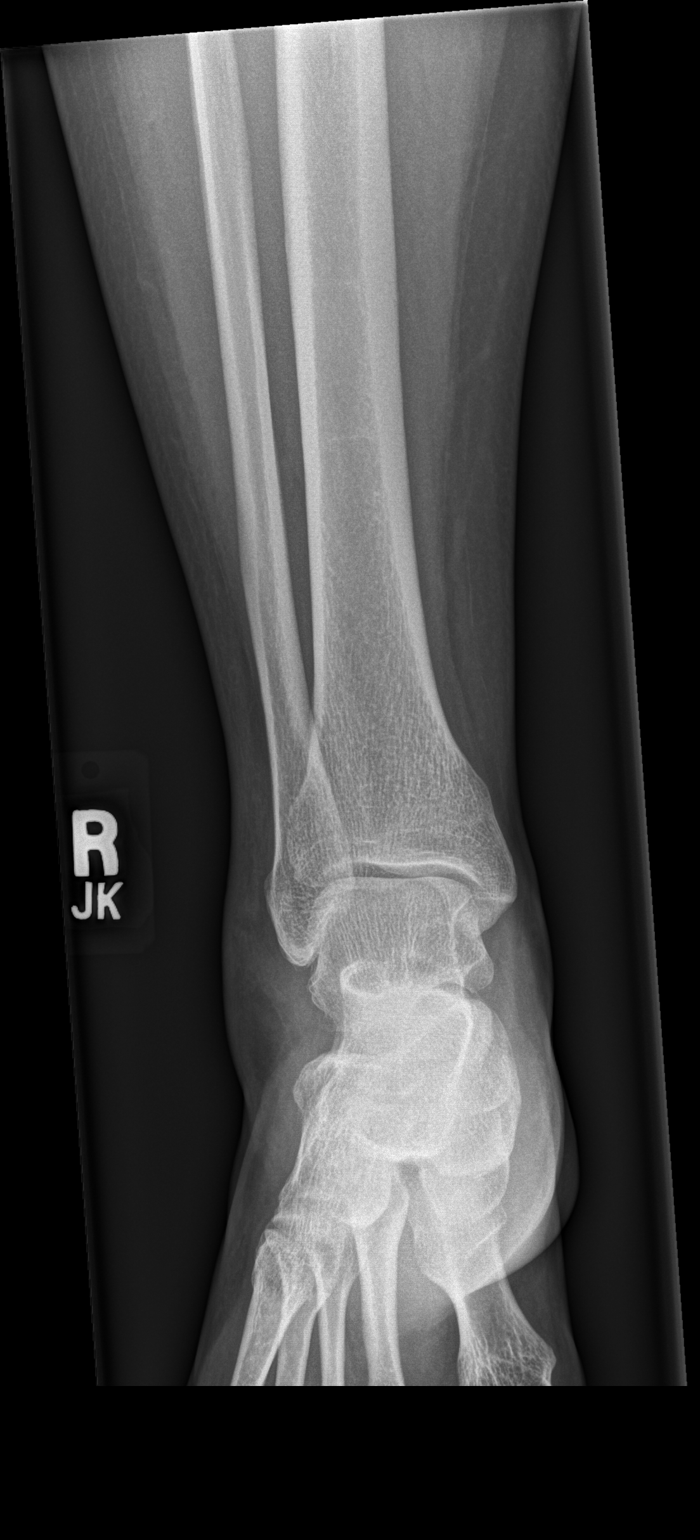

[x ankle obl right]
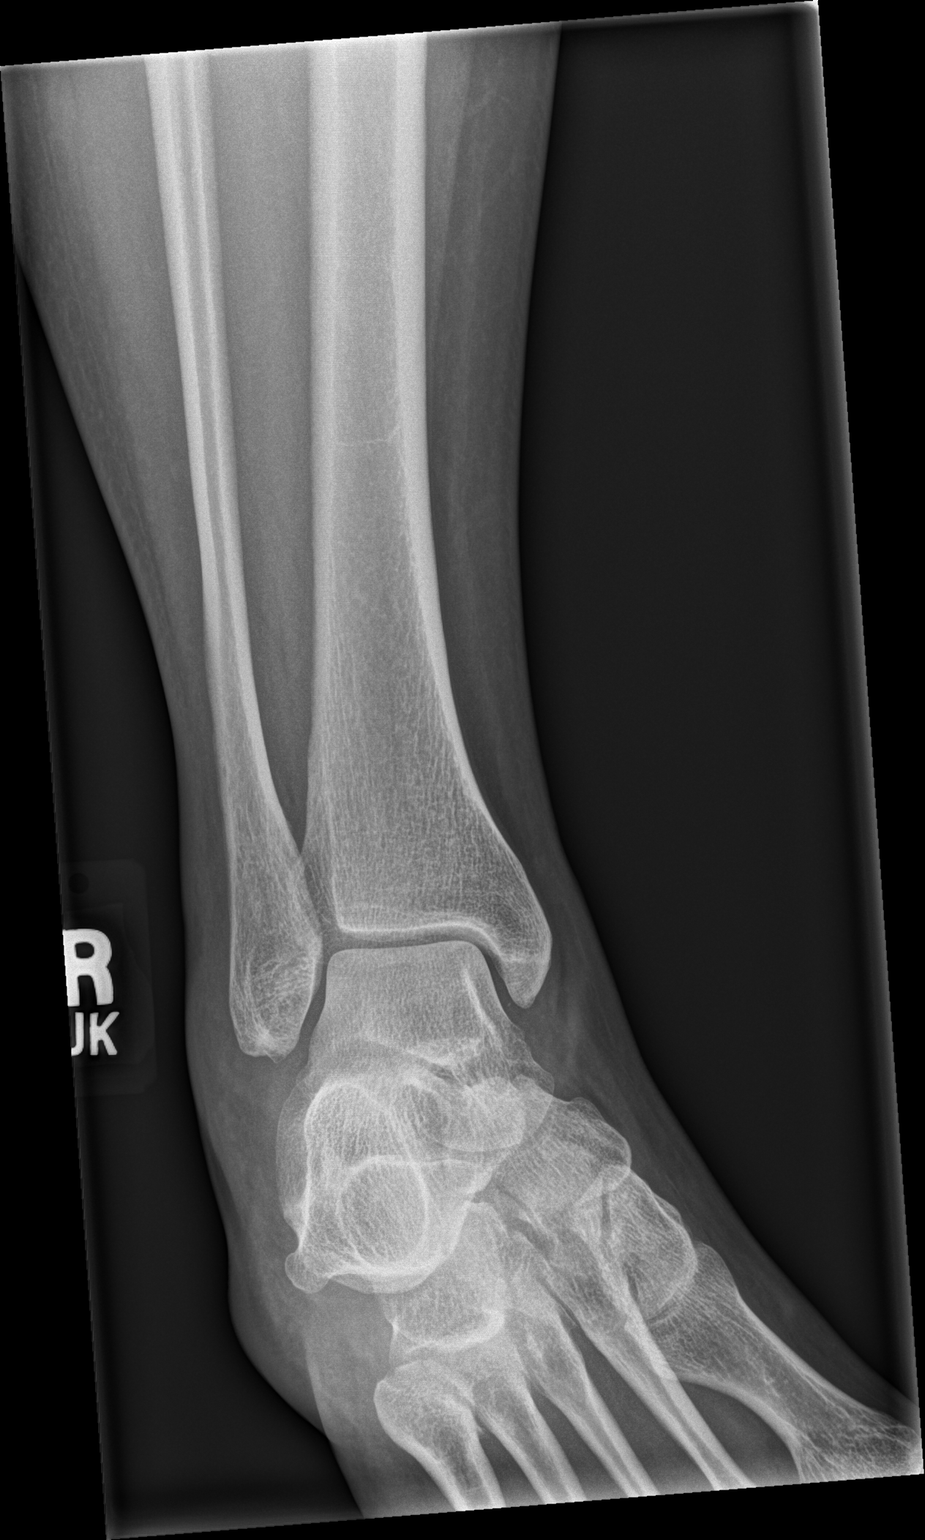

[x ankle lat right]
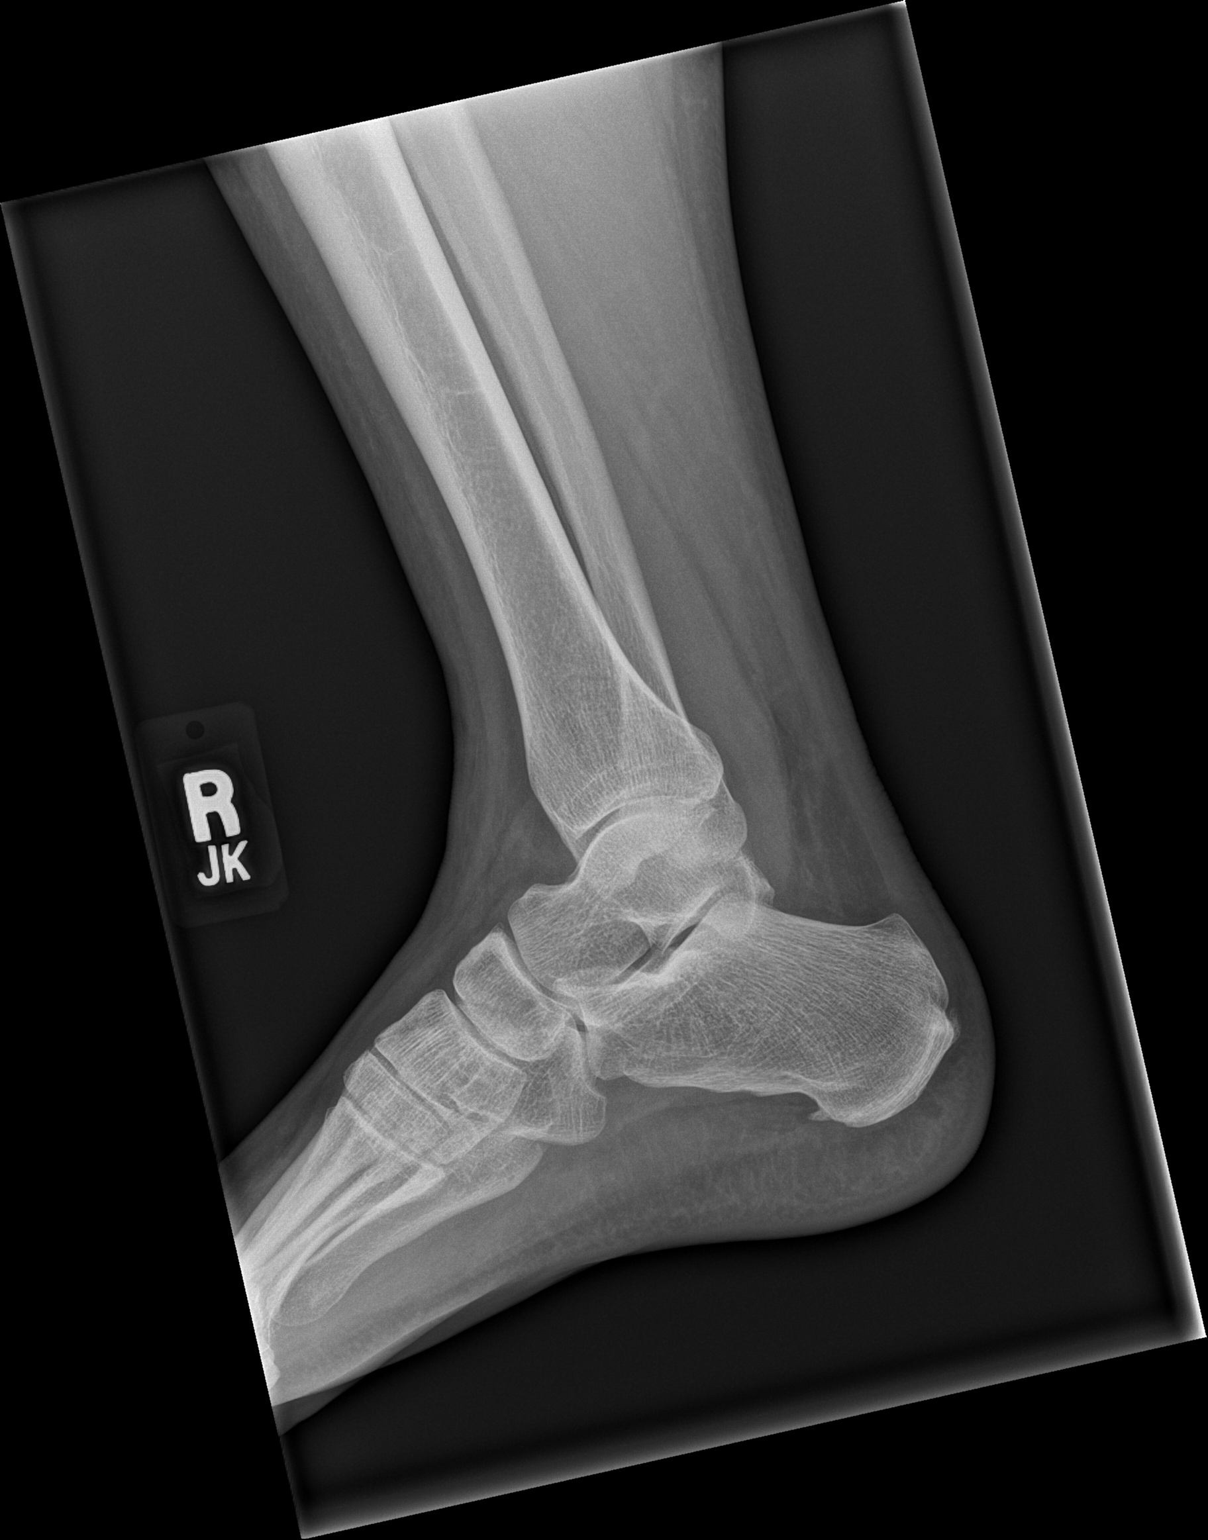

[3 of 3 positions shown; findings below may reference images not displayed]

FINDINGS: There is no evidence of fracture, dislocation, or joint effusion.
There is no evidence of arthropathy or other focal bone abnormality.
Soft tissues are unremarkable.
IMPRESSION: No acute osseous abnormality in the right ankle.

## 2022-03-16 DIAGNOSIS — Z6835 Body mass index (BMI) 35.0-35.9, adult: Secondary | ICD-10-CM | POA: Diagnosis not present

## 2022-03-16 DIAGNOSIS — E119 Type 2 diabetes mellitus without complications: Secondary | ICD-10-CM | POA: Diagnosis not present

## 2022-03-16 DIAGNOSIS — E782 Mixed hyperlipidemia: Secondary | ICD-10-CM | POA: Diagnosis not present

## 2022-03-20 ENCOUNTER — Emergency Department (HOSPITAL_COMMUNITY)
Admission: EM | Admit: 2022-03-20 | Discharge: 2022-03-20 | Disposition: A | Discharge status: Home / Self Care | Payer: 59 | Attending: Emergency Medicine | Admitting: Emergency Medicine

## 2022-03-20 ENCOUNTER — Other Ambulatory Visit: Payer: Self-pay

## 2022-03-20 ENCOUNTER — Encounter (HOSPITAL_COMMUNITY): Payer: Self-pay

## 2022-03-20 DIAGNOSIS — R42 Dizziness and giddiness: Secondary | ICD-10-CM | POA: Diagnosis not present

## 2022-03-20 DIAGNOSIS — R1032 Left lower quadrant pain: Secondary | ICD-10-CM | POA: Insufficient documentation

## 2022-03-20 DIAGNOSIS — R112 Nausea with vomiting, unspecified: Secondary | ICD-10-CM | POA: Diagnosis not present

## 2022-03-20 DIAGNOSIS — E161 Other hypoglycemia: Secondary | ICD-10-CM | POA: Diagnosis not present

## 2022-03-20 DIAGNOSIS — R110 Nausea: Secondary | ICD-10-CM | POA: Diagnosis not present

## 2022-03-20 DIAGNOSIS — E162 Hypoglycemia, unspecified: Secondary | ICD-10-CM | POA: Diagnosis not present

## 2022-03-20 DIAGNOSIS — R1111 Vomiting without nausea: Secondary | ICD-10-CM | POA: Diagnosis not present

## 2022-03-20 DIAGNOSIS — Z794 Long term (current) use of insulin: Secondary | ICD-10-CM | POA: Diagnosis not present

## 2022-03-20 DIAGNOSIS — R197 Diarrhea, unspecified: Secondary | ICD-10-CM | POA: Insufficient documentation

## 2022-03-20 DIAGNOSIS — Z20822 Contact with and (suspected) exposure to covid-19: Secondary | ICD-10-CM | POA: Diagnosis not present

## 2022-03-20 LAB — COMPREHENSIVE METABOLIC PANEL
ALT: 20 U/L (ref 0–44)
AST: 23 U/L (ref 15–41)
Albumin: 4 g/dL (ref 3.5–5.0)
Alkaline Phosphatase: 75 U/L (ref 38–126)
Anion gap: 10 (ref 5–15)
BUN: 8 mg/dL (ref 6–20)
CO2: 20 mmol/L — ABNORMAL LOW (ref 22–32)
Calcium: 8.9 mg/dL (ref 8.9–10.3)
Chloride: 106 mmol/L (ref 98–111)
Creatinine, Ser: 0.73 mg/dL (ref 0.44–1.00)
GFR, Estimated: >60 mL/min (ref >60)
Glucose, Bld: 250 mg/dL — ABNORMAL HIGH (ref 70–99)
Potassium: 3.8 mmol/L (ref 3.5–5.1)
Sodium: 136 mmol/L (ref 135–145)
Total Bilirubin: 1 mg/dL (ref 0.3–1.2)
Total Protein: 7.6 g/dL (ref 6.5–8.1)

## 2022-03-20 LAB — RESP PANEL BY RT-PCR (FLU A&B, COVID) ARPGX2
Influenza A by PCR: NEGATIVE
Influenza B by PCR: NEGATIVE
SARS Coronavirus 2 by RT PCR: NEGATIVE

## 2022-03-20 LAB — CBC
HCT: 42.9 % (ref 36.0–46.0)
Hemoglobin: 14 g/dL (ref 12.0–15.0)
MCH: 29.1 pg (ref 26.0–34.0)
MCHC: 32.6 g/dL (ref 30.0–36.0)
MCV: 89.2 fL (ref 80.0–100.0)
Platelets: 344 10*3/uL (ref 150–400)
RBC: 4.81 MIL/uL (ref 3.87–5.11)
RDW: 13.6 % (ref 11.5–15.5)
WBC: 15.9 10*3/uL — ABNORMAL HIGH (ref 4.0–10.5)
nRBC: 0 % (ref 0.0–0.2)

## 2022-03-20 LAB — I-STAT BETA HCG BLOOD, ED (MC, WL, AP ONLY): I-stat hCG, quantitative: <5 m[IU]/mL (ref <5)

## 2022-03-20 LAB — LIPASE, BLOOD: Lipase: 37 U/L (ref 11–51)

## 2022-03-20 LAB — CBG MONITORING, ED: Glucose-Capillary: 144 mg/dL — ABNORMAL HIGH (ref 70–99)

## 2022-03-20 MED ORDER — SODIUM CHLORIDE 0.9 % IV BOLUS
1000.0000 mL | Freq: Once | INTRAVENOUS | Status: AC
  Administered 2022-03-20: 1000 mL via INTRAVENOUS

## 2022-03-20 MED ORDER — ONDANSETRON HCL 4 MG PO TABS: 4.0000 mg | ORAL_TABLET | Freq: Four times a day (QID) | ORAL | 0 refills | Status: DC

## 2022-03-20 MED ORDER — PROCHLORPERAZINE EDISYLATE 10 MG/2ML IJ SOLN
10.0000 mg | Freq: Once | INTRAMUSCULAR | Status: AC
  Administered 2022-03-20: 10 mg via INTRAVENOUS
  Filled 2022-03-20: qty 2

## 2022-03-20 MED ORDER — DICYCLOMINE HCL 10 MG PO CAPS
10.0000 mg | ORAL_CAPSULE | Freq: Once | ORAL | Status: AC
  Administered 2022-03-20: 10 mg via ORAL
  Filled 2022-03-20: qty 1

## 2022-03-20 NOTE — ED Notes (Signed)
Pt left wo completing the whole work-up. Did not want to have any vitals taken. Went over the [employer] w the pt, and the pt verbalized understanding.  ?

## 2022-03-20 NOTE — ED Provider Notes (Signed)
?[facility] EMERGENCY DEPARTMENT ?Provider Note ? ? ?CSN: [id number] ?Arrival date & time: 03/20/22  1232 ? ?  ? ?History ? ?Chief Complaint  ?Patient presents with  ? Nausea  ? Emesis  ? Diarrhea  ? ? ?Jamie [name] is a 41 y.o. female Is a 41 year old female with past medical history significant for previous diverticulitis who presents with concern for nausea, vomiting, diarrhea since yesterday at 10 AM.  Patient reports that she had something similar around 2 weeks ago, resolved on its own without treatment.  She was going to tough it out but felt significantly dizzy, lightheaded, on EMS arrival patient had blood sugar of 27.  She was given 5 mg D10, 4 mg Zofran prior to arrival.  On my evaluation she endorses ongoing nausea, some minimal abdominal pain, cramping.  She does report that it is more left lower quadrant than anywhere else throughout abdomen.  Rates it 4/10. ? ? ?Emesis ?Associated symptoms: diarrhea   ?Diarrhea ?Associated symptoms: vomiting   ? ?  ? ?Home Medications ?Prior to Admission medications   ?Medication Sig Start Date End Date Taking? Authorizing Provider  ?ALPRAZolam (XANAX) 0.5 MG tablet Take 0.5 mg by mouth 3 (three) times daily as needed for anxiety.   Yes [provider]  ?diphenhydrAMINE (BENADRYL) 25 MG tablet Take 25 mg by mouth at bedtime as needed for sleep.   Yes [provider]  ?ibuprofen (ADVIL) 200 MG tablet Take 800 mg by mouth every 6 (six) hours as needed for mild pain.   Yes [provider]  ?medroxyPROGESTERone (DEPO-PROVERA) 150 MG/ML injection Inject 150 mg into the muscle every 3 (three) months. 11/29/20  Yes [provider]  ?ondansetron (ZOFRAN) 4 MG tablet Take 1 tablet (4 mg total) by mouth every 6 (six) hours. 03/20/22  Yes Arlen Legendre H, PA-C  ?TRULICITY 1.5 MG/0.5ML SOPN Inject 1.5 mg into the skin every Friday. 02/26/22  Yes [provider]  ?metroNIDAZOLE (FLAGYL) 500 MG tablet Take 1 tablet  (500 mg total) by mouth 2 (two) times daily. ?Patient not taking: Reported on 03/20/2022 10/18/21   Muthersbaugh, Dahlia Client, PA-C  ?   ? ?Allergies    ?Patient has no known allergies.   ? ?Review of Systems   ?Review of Systems  ?Gastrointestinal:  Positive for diarrhea, nausea and vomiting.  ?All other systems reviewed and are negative. ? ?Physical Exam ?Updated Vital Signs ?BP 121/83   Pulse (!) 104   Temp 98 ?F (36.7 ?C)   Resp 16   SpO2 97%  ?Physical Exam ?Vitals and nursing note reviewed.  ?Constitutional:   ?   General: She is not in acute distress. ?   Appearance: Normal appearance. She is ill-appearing.  ?HENT:  ?   Head: Normocephalic and atraumatic.  ?Eyes:  ?   General:     ?   Right eye: No discharge.     ?   Left eye: No discharge.  ?Cardiovascular:  ?   Rate and Rhythm: Normal rate and regular rhythm.  ?   Heart sounds: No murmur heard. ?  No friction rub. No gallop.  ?Pulmonary:  ?   Effort: Pulmonary effort is normal.  ?   Breath sounds: Normal breath sounds.  ?Abdominal:  ?   General: Bowel sounds are normal.  ?   Palpations: Abdomen is soft.  ?   Comments: Minimal tenderness to palpation left lower quadrant, no rebound, rigidity, guarding.  No ecchymosis noted.  No abdominal  distention noted.  No masses palpated.  ?Skin: ?   General: Skin is warm and dry.  ?   Capillary Refill: Capillary refill takes less than 2 seconds.  ?Neurological:  ?   Mental Status: She is alert and oriented to person, place, and time.  ?Psychiatric:     ?   Mood and Affect: Mood normal.     ?   Behavior: Behavior normal.  ? ? ?ED Results / Procedures / Treatments   ?Labs ?(all labs ordered are listed, but only abnormal results are displayed) ?Labs Reviewed  ?COMPREHENSIVE METABOLIC PANEL - Abnormal; Notable for the following components:  ?    Result Value  ? CO2 20 (*)   ? Glucose, Bld 250 (*)   ? All other components within normal limits  ?CBC - Abnormal; Notable for the following components:  ? WBC 15.9 (*)   ? All other  components within normal limits  ?CBG MONITORING, ED - Abnormal; Notable for the following components:  ? Glucose-Capillary 144 (*)   ? All other components within normal limits  ?RESP PANEL BY RT-PCR (FLU A&B, COVID) [id number]  ?LIPASE, BLOOD  ?URINALYSIS, ROUTINE W REFLEX MICROSCOPIC  ?I-STAT BETA HCG BLOOD, ED ([facility], [facility], [facility] ONLY)  ? ? ?EKG ?None ? ?Radiology ?No results found. ? ?Procedures ?Procedures  ? ? ?Medications Ordered in ED ?Medications  ?sodium chloride 0.9 % bolus 1,000 mL (0 mLs Intravenous Stopped 03/20/22 1521)  ?prochlorperazine (COMPAZINE) injection 10 mg (10 mg Intravenous Given 03/20/22 1347)  ?dicyclomine (BENTYL) capsule 10 mg (10 mg Oral Given 03/20/22 1354)  ? ? ?ED Course/ Medical Decision Making/ A&P ?  ?                        ?Medical Decision Making ?Amount and/or Complexity of Data Reviewed ?Labs: ordered. ? ?Risk ?Prescription drug management. ? ? ?This patient presents to the ED for concern of minimal abdominal pain, nausea vomiting and diarrhea for the last 2 days, this involves an extensive number of treatment options, and is a complaint that carries with it a high risk of complications and morbidity. The emergent differential diagnosis prior to evaluation includes, but is not limited to, diverticulitis, gastroenteritis, gastritis, abdominal ischemia, irritable bowel syndrome,-year-old bowel disease, versus other.  ? ?This is not an exhaustive differential.  ? ?Past Medical History / Co-morbidities / Social History: ?Previous history of diverticulitis ? ?Additional history: ?External records from outside source obtained and reviewed including outpatient urgent care, cardiology visits. ? ?Physical Exam: ?Physical exam performed. The pertinent findings include: Somewhat ill-appearing patient with nausea, left lower quadrant abdominal pain that is not to severe in intensity, with no rebound, rigidity, guarding.  After fluid bolus, Compazine x1 patient is much more well-appearing. ? ?Lab  Tests: ?I ordered, and personally interpreted labs.  The pertinent results include: Elevated white blood cells, 15.9.  CMP with glucose of 250, bicarb 20.  Some acidosis likely secondary to excessive emesis.  RVP negative.  Urinalysis not collected.  CBG on arrival was 144.  Patient's blood glucose by EMS was 27 prior to D10. ?  ?Medications: ?I ordered medication including fluid bolus, Bentyl, Compazine for abdominal cramping, nausea, vomiting, diarrhea. Reevaluation of the patient after these medicines showed that the patient improved. I have reviewed the patients home medicines and have made adjustments as needed. ?  ?Disposition: ?After consideration of the diagnostic results and the patients response to treatment, I feel that patient appears stable for  disposition.  Patient actually requested discharge prior to completion of her work-up.  We discussed that they do not have her electrolytes back, and would not recommend discharge until her lab work has been completed.  Patient reports that she needs to go, feels entirely improved, and knows to return if symptoms worsen.  Discussed return precautions extensively with patient prior to discharge.  Discussed that I cannot make any clinical judgments based on what her presumed diagnosis is prior to finishing her work-up.  Patient understands and agrees to this plan, and understands the return precautions.  Discussed I will not discharge her AGAINST MEDICAL ADVICE, but she needs to return if her symptoms worsen.  ? ?Portions of this report may have been transcribed using voice recognition software. Every effort was made to ensure accuracy; however, inadvertent computerized transcription errors may be present. ? ?Final Clinical Impression(s) / ED Diagnoses ?Final diagnoses:  ?Nausea vomiting and diarrhea  ? ? ?Rx / DC Orders ?ED Discharge Orders   ? ?      Ordered  ?  ondansetron (ZOFRAN) 4 MG tablet  Every 6 hours       ? 03/20/22 1459  ? ?  ?  ? ?  ? ? ?  ?Olene Floss, PA-C ?03/20/22 1618 ? ?  ?Mancel Bale, MD ?03/20/22 1806 ? ?

## 2022-03-20 NOTE — Discharge Instructions (Signed)
As we discussed you did not complete your work-up today, my suspicion is that you may have a viral gastroenteritis versus early diverticulitis versus irritable bowel, but it is unclear.  Since you do not complete your work-up today it is important that you return to the emergency department if you start to feel worse.  If you have any feeling of lightheadedness, concern for low blood sugar, blood in your stool, blood in your urine. ? ?You can use the nausea medication I am prescribing as needed.  I recommend that you drink plenty of fluids, especially something with electrolytes such as Pedialyte, Gatorade.  I recommend that you follow-up with your primary care doctor sometime later this week to ensure that you continue to feel better. ?

## 2022-03-20 NOTE — ED Triage Notes (Signed)
Pt here nausea, vomiting and diarrhea started yesterday. Upon ems, pt's sugar was 27. Hx diverticulitis. A& O x4   ?EMS GIVEN 5mg  D10, 4mg  zofran ? Bp 128/76 ?Hr 90  ?100%RA ?

## 2022-04-27 ENCOUNTER — Ambulatory Visit (INDEPENDENT_AMBULATORY_CARE_PROVIDER_SITE_OTHER): Payer: 59 | Admitting: Physician Assistant

## 2022-04-27 ENCOUNTER — Encounter: Payer: Self-pay | Admitting: Physician Assistant

## 2022-04-27 VITALS — BP 108/70 | HR 82 | Temp 97.7°F | Ht 63.0 in | Wt 214.0 lb

## 2022-04-27 DIAGNOSIS — F419 Anxiety disorder, unspecified: Secondary | ICD-10-CM | POA: Diagnosis not present

## 2022-04-27 DIAGNOSIS — F410 Panic disorder [episodic paroxysmal anxiety] without agoraphobia: Secondary | ICD-10-CM

## 2022-04-27 DIAGNOSIS — E119 Type 2 diabetes mellitus without complications: Secondary | ICD-10-CM

## 2022-04-27 DIAGNOSIS — Z3042 Encounter for surveillance of injectable contraceptive: Secondary | ICD-10-CM

## 2022-04-27 DIAGNOSIS — Z7689 Persons encountering health services in other specified circumstances: Secondary | ICD-10-CM

## 2022-04-27 DIAGNOSIS — R69 Illness, unspecified: Secondary | ICD-10-CM | POA: Diagnosis not present

## 2022-04-27 MED ORDER — DAPAGLIFLOZIN PROPANEDIOL 10 MG PO TABS: 10.0000 mg | ORAL_TABLET | Freq: Every day | ORAL | 2 refills | Status: AC

## 2022-04-27 NOTE — Patient Instructions (Signed)

## 2022-04-27 NOTE — Progress Notes (Signed)
? ?New Patient Office Visit ? ?Subjective   ? ?Patient ID: Jamie [name], female    DOB: [birth year]  Age: 41 y.o. MRN: [id number] ? ?CC:  ?Chief Complaint  ?Patient presents with  ? New Patient (Initial Visit)  ? ? ?HPI ?[name] presents to establish care. Patient has a past medical hx of type 2 diabetes mellitus and anxiety. Patient reports started Trulicity about 1 month ago and stopped medication due to upset stomach. In the past has tried Metformin but medication caused feet numbness. Patient does not check sugar at home. Patient reports tries to take Paxil at bedtime as directed but sometimes forgets, which she takes for anxiety. Also takes Xanax once or twice daily as needed, some days will not take medication at all. Reports has random episodes of panic attacks. No specific triggers. Is on depo-provera for contraception. Surgeries include a meniscus repair of right knee by Dr. Despina Hick and 3 c-sections.  ? ? ?Outpatient Encounter Medications as of 04/27/2022  ?Medication Sig  ? dapagliflozin propanediol (FARXIGA) 10 MG TABS tablet Take 1 tablet (10 mg total) by mouth daily before breakfast.  ? ALPRAZolam (XANAX) 0.5 MG tablet Take 0.5 mg by mouth 3 (three) times daily as needed for anxiety.  ? diphenhydrAMINE (BENADRYL) 25 MG tablet Take 25 mg by mouth at bedtime as needed for sleep.  ? ibuprofen (ADVIL) 200 MG tablet Take 800 mg by mouth every 6 (six) hours as needed for mild pain.  ? medroxyPROGESTERone (DEPO-PROVERA) 150 MG/ML injection Inject 150 mg into the muscle every 3 (three) months.  ? [DISCONTINUED] metroNIDAZOLE (FLAGYL) 500 MG tablet Take 1 tablet (500 mg total) by mouth 2 (two) times daily. (Patient not taking: Reported on 03/20/2022)  ? [DISCONTINUED] ondansetron (ZOFRAN) 4 MG tablet Take 1 tablet (4 mg total) by mouth every 6 (six) hours. (Patient not taking: Reported on 04/27/2022)  ? [DISCONTINUED] TRULICITY 1.5 MG/0.5ML SOPN Inject 1.5 mg into the skin every Friday. (Patient not taking:  Reported on 04/27/2022)  ? ?No facility-administered encounter medications on file as of 04/27/2022.  ? ? ?Past Medical History:  ?Diagnosis Date  ? Pregnancy diabetes 2014  ? Now hyperglycemia only  ? ? ?Past Surgical History:  ?Procedure Laterality Date  ? CESAREAN SECTION    ? CESAREAN SECTION WITH BILATERAL TUBAL LIGATION  01/21/2013  ? Procedure: CESAREAN SECTION WITH BILATERAL TUBAL LIGATION;  Surgeon: Jeani Hawking, MD;  Location: [facility];  Service: Obstetrics;  Laterality: Bilateral;  repeat ?edc 01/27/13  ? DILATION AND CURETTAGE OF UTERUS    ? KNEE SURGERY    ? ? ?Family History  ?Problem Relation Age of Onset  ? Hypertension Mother   ? Hypertension Father   ? Other Neg Hx   ? ? ?Social History  ? ?Socioeconomic History  ? Marital status: Single  ?  Spouse name: Not on file  ? Number of children: 3  ? Years of education: Not on file  ? Highest education level: Not on file  ?Occupational History  ? Occupation: house cleaning  ?  Comment: Self employed  ?Tobacco Use  ? Smoking status: Former  ?  Packs/day: 0.50  ?  Types: Cigarettes  ?  Quit date: 06/14/2018  ?  Years since quitting: 3.8  ? Smokeless tobacco: Never  ?Vaping Use  ? Vaping Use: Former  ?Substance and Sexual Activity  ? Alcohol use: Yes  ?  Comment: social  ? Drug use: No  ? Sexual activity: Yes  ?  Other Topics Concern  ? Not on file  ?Social History Narrative  ? Not on file  ? ?Social Determinants of Health  ? ?Financial Resource Strain: Not on file  ?Food Insecurity: Not on file  ?Transportation Needs: Not on file  ?Physical Activity: Not on file  ?Stress: Not on file  ?Social Connections: Not on file  ?Intimate Partner Violence: Not on file  ? ? ?ROS ?Review of Systems:  ?A fourteen system review of systems was performed and found to be positive as per HPI. ? ?Objective   ? ?BP 108/70   Pulse 82   Temp 97.7 ?F (36.5 ?C)   Ht 5\' 3"  (1.6 m)   Wt 214 lb (97.1 kg)   LMP  (LMP Unknown)   SpO2 99%   BMI 37.91 kg/m?  ? ?Physical Exam ?General:   Well Developed, well nourished, appropriate for stated age.  ?Neuro:  Alert and oriented,  extra-ocular muscles intact  ?HEENT:  Normocephalic, atraumatic, neck supple  ?Skin:  no gross rash, warm, pink. ?Cardiac:  RRR, S1 S2 ?Respiratory: CTA B/L  ?Vascular:  Ext warm, no cyanosis apprec.; cap RF less 2 sec. ?Psych:  No HI/SI, judgement and insight good, Euthymic mood. Full Affect. ? ? ?Assessment & Plan:  ? ?Problem List Items Addressed This Visit   ?None ?Visit Diagnoses   ? ? Type 2 diabetes mellitus without complication, without long-term current use of insulin (HCC)    -  Primary  ? Relevant Medications  ? dapagliflozin propanediol (FARXIGA) 10 MG TABS tablet  ? Encounter to establish care      ? Anxiety      ? On Depo-Provera for contraception      ? Panic attack      ? ?  ? ?Encounter to establish care: ?-Will request records from PCP including last depo-provera injection and review when available. ?-Will continue Depo-Provera. ?-Reviewed records in Epic.  ? ?Type 2 diabetes mellitus without complication, without long-term current use of insulin: ?-Will request previous PCP records. Per pt, last A1c was a few weeks ago.  ?-Patient unable to tolerate Metformin and Trulicity due to side effects. Discussed alternative management options and is agreeable to try SGL-2i so will start Farxiga 10 mg. Discussed potential side effects and advised to let me know if unable to tolerate medication. Pt verbalized understanding. Recommend to monitor glucose at home. ?-Will assess medication therapy and repeat A1c in 3 months.  ? ?Anxiety, Panic attack: ?-Discussed with patient improving medication adherence to stabilize her anxiety which will likely reduce the panic attacks and alprazolam use. Discussed office policy for controlled substances- benzodiazapine which includes 2 refills per 12 month period. Advised agreeable to 30 tablets of Alprazolam 0.5 mg BID prn for anxiety every 6 months. Also discussed referral to  psychiatry for medication management. Patient reports will consider referral and let me know if would like to pursue. States will inquire with her insurance what facilities are in-network.  ? ? ? ? ?Return in about 3 months (around 07/28/2022) for DM, mood.  ? ?09/27/2022, PA-C ? ? ?

## 2022-07-27 DIAGNOSIS — E782 Mixed hyperlipidemia: Secondary | ICD-10-CM | POA: Diagnosis not present

## 2022-07-27 DIAGNOSIS — E119 Type 2 diabetes mellitus without complications: Secondary | ICD-10-CM | POA: Diagnosis not present

## 2022-07-27 DIAGNOSIS — Z0001 Encounter for general adult medical examination with abnormal findings: Secondary | ICD-10-CM | POA: Diagnosis not present

## 2022-07-27 DIAGNOSIS — Z6836 Body mass index (BMI) 36.0-36.9, adult: Secondary | ICD-10-CM | POA: Diagnosis not present

## 2022-07-27 DIAGNOSIS — R69 Illness, unspecified: Secondary | ICD-10-CM | POA: Diagnosis not present

## 2022-09-14 DIAGNOSIS — Z8709 Personal history of other diseases of the respiratory system: Secondary | ICD-10-CM | POA: Diagnosis not present

## 2022-09-14 DIAGNOSIS — R059 Cough, unspecified: Secondary | ICD-10-CM | POA: Diagnosis not present

## 2022-09-14 DIAGNOSIS — Z03818 Encounter for observation for suspected exposure to other biological agents ruled out: Secondary | ICD-10-CM | POA: Diagnosis not present

## 2022-09-25 ENCOUNTER — Other Ambulatory Visit: Payer: Self-pay

## 2022-09-25 ENCOUNTER — Emergency Department
Admission: EM | Admit: 2022-09-25 | Discharge: 2022-09-25 | Disposition: A | Discharge status: Home / Self Care | Payer: 59 | Attending: Emergency Medicine | Admitting: Emergency Medicine

## 2022-09-25 DIAGNOSIS — N390 Urinary tract infection, site not specified: Secondary | ICD-10-CM

## 2022-09-25 DIAGNOSIS — R300 Dysuria: Secondary | ICD-10-CM | POA: Diagnosis not present

## 2022-09-25 DIAGNOSIS — M545 Low back pain, unspecified: Secondary | ICD-10-CM | POA: Insufficient documentation

## 2022-09-25 LAB — URINALYSIS, ROUTINE W REFLEX MICROSCOPIC
Bilirubin Urine: NEGATIVE
Glucose, UA: NEGATIVE mg/dL
Ketones, ur: NEGATIVE mg/dL
Nitrite: POSITIVE — AB
Protein, ur: 100 mg/dL — AB
RBC / HPF: >50 RBC/hpf — ABNORMAL HIGH (ref 0–5)
Specific Gravity, Urine: 1.019 (ref 1.005–1.030)
WBC, UA: >50 WBC/hpf — ABNORMAL HIGH (ref 0–5)
pH: 6 (ref 5.0–8.0)

## 2022-09-25 LAB — POC URINE PREG, ED: Preg Test, Ur: NEGATIVE

## 2022-09-25 MED ORDER — BENZONATATE 100 MG PO CAPS: ORAL_CAPSULE | ORAL | 0 refills | Status: DC

## 2022-09-25 MED ORDER — PHENAZOPYRIDINE HCL 100 MG PO TABS: 100.0000 mg | ORAL_TABLET | Freq: Three times a day (TID) | ORAL | 0 refills | Status: AC | PRN

## 2022-09-25 MED ORDER — CEPHALEXIN 500 MG PO CAPS: 500.0000 mg | ORAL_CAPSULE | Freq: Three times a day (TID) | ORAL | 0 refills | Status: AC

## 2022-09-25 MED ORDER — CEFTRIAXONE SODIUM 1 G IJ SOLR
1.0000 g | Freq: Once | INTRAMUSCULAR | Status: AC
  Administered 2022-09-25: 1 g via INTRAMUSCULAR
  Filled 2022-09-25: qty 10

## 2022-09-25 MED ORDER — PHENAZOPYRIDINE HCL 200 MG PO TABS
200.0000 mg | ORAL_TABLET | Freq: Once | ORAL | Status: AC
  Administered 2022-09-25: 200 mg via ORAL
  Filled 2022-09-25: qty 1

## 2022-09-25 NOTE — ED Provider Notes (Signed)
[facility] Emergency Department Provider Note     Event Date/Time   First MD Initiated Contact with Patient 09/25/22 2220     (approximate)   History   Dysuria   HPI  Jamie [name] is a 41 y.o. female with a noncontributory medical history, presents to the ED for evaluation of dysuria and low back pain.  She reports onset of symptoms days.  She has been taken over-the-counter Azo with limited benefit.  She denies any gross hematuria.  Patient reports recent antibiotic course for pneumonia with amoxicillin and azithromycin, and prednisone.  She denies any associated fevers, chills, or sweats.     Physical Exam   Triage Vital Signs: ED Triage Vitals [09/25/22 2208]  Enc Vitals Group     BP (!) 152/97     Pulse Rate (!) 102     Resp 14     Temp 98.2 F (36.8 C)     Temp Source Oral     SpO2 95 %     Weight 217 lb (98.4 kg)     Height 5\' 3"  (1.6 m)     Head Circumference      Peak Flow      Pain Score 8     Pain Loc      Pain Edu?      Excl. in [place]?     Most recent vital signs: Vitals:   09/25/22 2208  BP: (!) 152/97  Pulse: (!) 102  Resp: 14  Temp: 98.2 F (36.8 C)  SpO2: 95%    General Awake, no distress. NAD CV:  Good peripheral perfusion.  RESP:  Normal effort.  ABD:  No distention.   ED Results / Procedures / Treatments   Labs (all labs ordered are listed, but only abnormal results are displayed) Labs Reviewed  URINALYSIS, ROUTINE W REFLEX MICROSCOPIC - Abnormal; Notable for the following components:      Result Value   Color, Urine YELLOW (*)    APPearance CLOUDY (*)    Hgb urine dipstick MODERATE (*)    Protein, ur 100 (*)    Nitrite POSITIVE (*)    Leukocytes,Ua MODERATE (*)    RBC / HPF >50 (*)    WBC, UA >50 (*)    Bacteria, UA MANY (*)    All other components within normal limits  POC URINE PREG, ED     EKG    ED Provider Interpretation:  No results found.   PROCEDURES:  Critical Care  performed: No  Procedures   MEDICATIONS ORDERED IN ED: Medications  cefTRIAXone (ROCEPHIN) injection 1 g (1 g Intramuscular Given 09/25/22 2308)  phenazopyridine (PYRIDIUM) tablet 200 mg (200 mg Oral Given 09/25/22 2309)     IMPRESSION / MDM / ASSESSMENT AND PLAN / ED COURSE  I reviewed the triage vital signs and the nursing notes.                              Differential diagnosis includes, but is not limited to, UTI, pyelonephritis, kidney stones  Patient's presentation is most consistent with acute complicated illness / injury requiring diagnostic workup.  Patient's diagnosis is consistent with UTI.  Patient exam is benign and overall reassuring.  Patient will be discharged home with prescriptions for Keflex and Pyridium after an empiric IM dose of Rocephin in the ED.  Patient is to follow up with her primary provider as needed or otherwise  directed. Patient is given ED precautions to return to the ED for any worsening or new symptoms.     FINAL CLINICAL IMPRESSION(S) / ED DIAGNOSES   Final diagnoses:  Lower urinary tract infectious disease     Rx / DC Orders   ED Discharge Orders          Ordered    benzonatate (TESSALON PERLES) 100 MG capsule        09/25/22 2258    cephALEXin (KEFLEX) 500 MG capsule  3 times daily        09/25/22 2258    phenazopyridine (PYRIDIUM) 100 MG tablet  3 times daily PRN        09/25/22 2258             Note:  This document was prepared using Dragon voice recognition software and may include unintentional dictation errors.    Melvenia Needles, PA-C 09/25/22 2311    Vanessa Dalton, MD 09/25/22 [phone]

## 2022-09-25 NOTE — ED Notes (Signed)
Poct pregnancy Negative 

## 2022-09-25 NOTE — ED Triage Notes (Signed)
Pt presents via POV c/o dysuria and lower back pain. Reports recent abx treatment for pneumonia.

## 2022-09-25 NOTE — Discharge Instructions (Signed)
Take the antibiotic as directed. Drink plenty of fluids and empty your bladder regularly. Follow-up with your provider for ongoing symptoms.

## 2022-10-10 DIAGNOSIS — J9811 Atelectasis: Secondary | ICD-10-CM | POA: Diagnosis not present

## 2022-10-10 DIAGNOSIS — R052 Subacute cough: Secondary | ICD-10-CM | POA: Diagnosis not present

## 2022-10-10 DIAGNOSIS — R059 Cough, unspecified: Secondary | ICD-10-CM | POA: Diagnosis not present

## 2023-04-03 ENCOUNTER — Other Ambulatory Visit: Payer: Self-pay | Admitting: Internal Medicine

## 2023-04-03 DIAGNOSIS — Z1231 Encounter for screening mammogram for malignant neoplasm of breast: Secondary | ICD-10-CM

## 2023-06-29 ENCOUNTER — Ambulatory Visit (HOSPITAL_COMMUNITY)
Admission: EM | Admit: 2023-06-29 | Discharge: 2023-06-29 | Disposition: A | Discharge status: Home / Self Care | Payer: Managed Care, Other (non HMO)

## 2023-06-29 ENCOUNTER — Encounter (HOSPITAL_COMMUNITY): Payer: Self-pay

## 2023-06-29 DIAGNOSIS — J069 Acute upper respiratory infection, unspecified: Secondary | ICD-10-CM

## 2023-06-29 HISTORY — DX: Pure hypercholesterolemia, unspecified: E78.00

## 2023-06-29 HISTORY — DX: Anxiety disorder, unspecified: F41.9

## 2023-06-29 MED ORDER — BENZONATATE 100 MG PO CAPS: 100.0000 mg | ORAL_CAPSULE | Freq: Three times a day (TID) | ORAL | 0 refills | Status: AC

## 2023-06-29 MED ORDER — GUAIFENESIN ER 1200 MG PO TB12: 1200.0000 mg | ORAL_TABLET | Freq: Two times a day (BID) | ORAL | 0 refills | Status: AC

## 2023-06-29 NOTE — ED Provider Notes (Signed)
[facility]    CSN: [id number] Arrival date & time: 06/29/23  1736      History   Chief Complaint Chief Complaint  Patient presents with   Cough    HPI Jamie [name] is a 42 y.o. female.   Patient presents to urgent care for evaluation of cough, runny nose, sneezing, upper back pain with coughing that started 5 days ago on June 25, 2023. Reports generalized fatigue for the last 5-6 days since becoming ill. Cough is dry and non-productive. Reports slight sore throat. Reports chills without known documented fever at home.  Denies chest pain, heart palpitations, nausea, vomiting, diarrhea, abdominal pain, rash, dizziness, and bodyaches.  No recent antibiotic/steroid use. No history of chronic respiratory problems.  Former smoker, denies drug use.  She has not attempted use of any over-the-counter medications before coming to urgent care for symptoms.   Cough   Past Medical History:  Diagnosis Date   Anxiety    High cholesterol    Pregnancy diabetes 2014   Now hyperglycemia only    Patient Active Problem List   Diagnosis Date Noted   Carpal tunnel syndrome of right wrist 12/30/2020   Impingement syndrome of right shoulder region 11/11/2018    Past Surgical History:  Procedure Laterality Date   CESAREAN SECTION     CESAREAN SECTION WITH BILATERAL TUBAL LIGATION  01/21/2013   Procedure: CESAREAN SECTION WITH BILATERAL TUBAL LIGATION;  Surgeon: Jeani Hawking, MD;  Location: [facility];  Service: Obstetrics;  Laterality: Bilateral;  repeat edc 01/27/13   DILATION AND CURETTAGE OF UTERUS     KNEE SURGERY      OB History     Gravida  7   Para  1   Term  1   Preterm      AB  4   Living  3      SAB  2   IAB  2   Ectopic      Multiple      Live Births  1            Home Medications    Prior to Admission medications   Medication Sig Start Date End Date Taking? Authorizing Provider  benzonatate (TESSALON) 100 MG capsule Take 1 capsule (100  mg total) by mouth every 8 (eight) hours. 06/29/23  Yes Carlisle Beers, FNP  Guaifenesin 1200 MG TB12 Take 1 tablet (1,200 mg total) by mouth in the morning and at bedtime. 06/29/23  Yes Carlisle Beers, FNP  ALPRAZolam Prudy Feeler) 0.5 MG tablet Take 0.5 mg by mouth 3 (three) times daily as needed for anxiety.    [provider]  atorvastatin (LIPITOR) 20 MG tablet Take 1 tablet by mouth daily.    [provider]  dapagliflozin propanediol (FARXIGA) 10 MG TABS tablet Take 1 tablet (10 mg total) by mouth daily before breakfast. 04/27/22   Abonza, Maritza, PA-C  diphenhydrAMINE (BENADRYL) 25 MG tablet Take 25 mg by mouth at bedtime as needed for sleep.    [provider]  ibuprofen (ADVIL) 200 MG tablet Take 800 mg by mouth every 6 (six) hours as needed for mild pain.    [provider]  medroxyPROGESTERone (DEPO-PROVERA) 150 MG/ML injection Inject 150 mg into the muscle every 3 (three) months. 11/29/20   [provider]  phenazopyridine (PYRIDIUM) 100 MG tablet Take 1 tablet (100 mg total) by mouth 3 (three) times daily as needed for pain. 09/25/22 09/25/23  Menshew, Smith Robert  Tomasa Blase, PA-C  TRINTELLIX 5 MG TABS tablet TAKE ONE TABLET BY MOUTH EVERY DAY AT THE SAME TIME [facility] DAY    [provider]    Family History Family History  Problem Relation Age of Onset   Hypertension Mother    Hypertension Father    Other Neg Hx     Social History Social History   Tobacco Use   Smoking status: Former    Packs/day: .5    Types: Cigarettes    Quit date: 06/14/2018    Years since quitting: 5.0   Smokeless tobacco: Never  Vaping Use   Vaping Use: Former  Substance Use Topics   Alcohol use: Yes    Comment: social   Drug use: No     Allergies   Patient has no known allergies.   Review of Systems Review of Systems  Respiratory:  Positive for cough.   Per HPI  Physical Exam Triage Vital Signs ED Triage Vitals [06/29/23 1805]  Enc  Vitals Group     BP 139/80     Pulse Rate (!) 105     Resp 16     Temp 98.8 F (37.1 C)     Temp Source Axillary     SpO2 94 %     Weight      Height      Head Circumference      Peak Flow      Pain Score      Pain Loc      Pain Edu?      Excl. in [place]?    No data found.  Updated Vital Signs BP 139/80 (BP Location: Right Arm)   Pulse (!) 105   Temp 98.8 F (37.1 C) (Axillary)   Resp 16   LMP 06/04/2023 (Approximate)   SpO2 94%   Visual Acuity Right Eye Distance:   Left Eye Distance:   Bilateral Distance:    Right Eye Near:   Left Eye Near:    Bilateral Near:     Physical Exam Vitals and nursing note reviewed.  Constitutional:      Appearance: She is not ill-appearing or toxic-appearing.  HENT:     Head: Normocephalic and atraumatic.     Right Ear: Hearing, tympanic membrane, ear canal and external ear normal.     Left Ear: Hearing, tympanic membrane, ear canal and external ear normal.     Nose: Congestion present.     Mouth/Throat:     Lips: Pink.     Mouth: Mucous membranes are moist. No injury.     Tongue: No lesions. Tongue does not deviate from midline.     Palate: No mass and lesions.     Pharynx: Oropharynx is clear. Uvula midline. No pharyngeal swelling, oropharyngeal exudate, posterior oropharyngeal erythema or uvula swelling.     Tonsils: No tonsillar exudate or tonsillar abscesses.  Eyes:     General: Lids are normal. Vision grossly intact. Gaze aligned appropriately.     Extraocular Movements: Extraocular movements intact.     Conjunctiva/sclera: Conjunctivae normal.  Cardiovascular:     Rate and Rhythm: Normal rate and regular rhythm.     Heart sounds: Normal heart sounds, S1 normal and S2 normal.  Pulmonary:     Effort: Pulmonary effort is normal. No respiratory distress.     Breath sounds: Normal breath sounds and air entry.  Musculoskeletal:     Cervical back: Neck supple.  Lymphadenopathy:     Cervical: No cervical adenopathy.  Skin:  General: Skin is warm and dry.     Capillary Refill: Capillary refill takes less than 2 seconds.     Findings: No rash.  Neurological:     General: No focal deficit present.     Mental Status: She is alert and oriented to person, place, and time. Mental status is at baseline.     Cranial Nerves: No dysarthria or facial asymmetry.  Psychiatric:        Mood and Affect: Mood normal.        Speech: Speech normal.        Behavior: Behavior normal.        Thought Content: Thought content normal.        Judgment: Judgment normal.      UC Treatments / Results  Labs (all labs ordered are listed, but only abnormal results are displayed) Labs Reviewed - No data to display  EKG   Radiology No results found.  Procedures Procedures (including critical care time)  Medications Ordered in UC Medications - No data to display  Initial Impression / Assessment and Plan / UC Course  I have reviewed the triage vital signs and the nursing notes.  Pertinent labs & imaging results that were available during my care of the patient were reviewed by me and considered in my medical decision making (see chart for details).   1.  Viral URI with cough Evaluation suggests viral URI etiology. Will manage this with recommendations for OTC and prescription medications for symptomatic relief. Encouraged to push fluids to stay well hydrated.  Imaging: deferred based on stable cardiopulmonary exam/hemodynamically stable vital signs Viral testing: deferred based on timing of illness.  Discussed red flag signs and symptoms of worsening condition,when to call the PCP office, return to urgent care, and when to seek higher level of care in the emergency department. Counseled patient regarding appropriate use of medications and potential side effects for all medications recommended or prescribed today. Patient verbalizes understanding and agreement with plan. Discharged in stable condition.     Final Clinical  Impressions(s) / UC Diagnoses   Final diagnoses:  Viral URI with cough     Discharge Instructions      You have a viral upper respiratory infection.   Use the following medicines to help with symptoms: - Plain Mucinex (guaifenesin) over the counter as directed every 12 hours to thin mucous so that you are able to get it out of your body easier. Drink plenty of water while taking this medication so that it works well in your body (at least 8 cups a day).  - Tylenol 1,000mg  and/or ibuprofen 600mg  every 6 hours with food as needed for aches/pains or fever/chills.  - Tessalon perles every 8 hours as needed for cough.  1 tablespoon of honey in warm water and/or salt water gargles may also help with symptoms. Humidifier to your room will help add water to the air and reduce coughing.  If you develop any new or worsening symptoms, please return.  If your symptoms are severe, please go to the emergency room.  Follow-up with your primary care provider for further evaluation and management of your symptoms as well as ongoing wellness visits.  I hope you feel better!     ED Prescriptions     Medication Sig Dispense Auth. Provider   benzonatate (TESSALON) 100 MG capsule Take 1 capsule (100 mg total) by mouth every 8 (eight) hours. 21 capsule Carlisle Beers, FNP   Guaifenesin 1200 MG TB12 Take  1 tablet (1,200 mg total) by mouth in the morning and at bedtime. 14 tablet Carlisle Beers, FNP      PDMP not reviewed this encounter.   Carlisle Beers, [place] 06/29/23 1902

## 2023-06-29 NOTE — ED Triage Notes (Signed)
Pt states cough and sob for the past few days states she just doesn't feel well.

## 2023-06-29 NOTE — Discharge Instructions (Signed)
You have a viral upper respiratory infection.   Use the following medicines to help with symptoms: - Plain Mucinex (guaifenesin) over the counter as directed every 12 hours to thin mucous so that you are able to get it out of your body easier. Drink plenty of water while taking this medication so that it works well in your body (at least 8 cups a day).  - Tylenol 1,000mg and/or ibuprofen 600mg every 6 hours with food as needed for aches/pains or fever/chills.  - Tessalon perles every 8 hours as needed for cough.  1 tablespoon of honey in warm water and/or salt water gargles may also help with symptoms. Humidifier to your room will help add water to the air and reduce coughing.  If you develop any new or worsening symptoms, please return.  If your symptoms are severe, please go to the emergency room.  Follow-up with your primary care provider for further evaluation and management of your symptoms as well as ongoing wellness visits.  I hope you feel better!  

## 2024-04-22 ENCOUNTER — Other Ambulatory Visit: Payer: Self-pay | Admitting: Internal Medicine

## 2024-04-22 DIAGNOSIS — R945 Abnormal results of liver function studies: Secondary | ICD-10-CM

## 2024-07-07 ENCOUNTER — Other Ambulatory Visit

## 2024-09-09 ENCOUNTER — Other Ambulatory Visit: Payer: Self-pay | Admitting: Obstetrics & Gynecology

## 2024-09-09 DIAGNOSIS — R928 Other abnormal and inconclusive findings on diagnostic imaging of breast: Secondary | ICD-10-CM

## 2024-09-17 ENCOUNTER — Ambulatory Visit

## 2024-09-17 ENCOUNTER — Ambulatory Visit
Admission: RE | Admit: 2024-09-17 | Discharge: 2024-09-17 | Disposition: A | Discharge status: Home / Self Care | Source: Ambulatory Visit | Attending: Obstetrics & Gynecology | Admitting: Obstetrics & Gynecology

## 2024-09-17 DIAGNOSIS — R928 Other abnormal and inconclusive findings on diagnostic imaging of breast: Secondary | ICD-10-CM

## 2024-12-30 ENCOUNTER — Emergency Department (HOSPITAL_BASED_OUTPATIENT_CLINIC_OR_DEPARTMENT_OTHER)
Admission: EM | Admit: 2024-12-30 | Discharge: 2024-12-30 | Disposition: A | Discharge status: Home / Self Care | Attending: Emergency Medicine | Admitting: Emergency Medicine

## 2024-12-30 ENCOUNTER — Encounter (HOSPITAL_BASED_OUTPATIENT_CLINIC_OR_DEPARTMENT_OTHER): Payer: Self-pay

## 2024-12-30 DIAGNOSIS — Z5321 Procedure and treatment not carried out due to patient leaving prior to being seen by health care provider: Secondary | ICD-10-CM | POA: Diagnosis not present

## 2024-12-30 DIAGNOSIS — K5900 Constipation, unspecified: Secondary | ICD-10-CM | POA: Insufficient documentation

## 2024-12-30 MED ORDER — GLYCERIN (LAXATIVE) 2 G RE SUPP: 1.0000 | Freq: Once | RECTAL | Status: DC

## 2024-12-30 NOTE — ED Triage Notes (Signed)
 Pt c/o constipation x3 days, recently started monjaro- week 2.  Still able to pass gas, denies NV. Home remedies (fiber, stool softener, x-lax) w no relief.

## 2024-12-30 NOTE — ED Provider Triage Note (Cosign Needed)
 Emergency Medicine Provider Triage Evaluation Note  Jamie [name] , a 44 y.o. female  was evaluated in triage.  Pt complains of constipation x 3 days, recently started on Monjuarno.  Has taken fiber yesterday, stool softener daily, ex-lax x 3 today. No abdominal pain.   Review of Systems  Positive: Constipation  Negative: Abdominal pain, vomiting.   Physical Exam  There were no vitals taken for this visit. Gen:   Awake, no distress   Resp:  Normal effort  MSK:   Moves extremities without difficulty  Other:    Medical Decision Making  Medically screening exam initiated at 5:36 PM.  Appropriate orders placed.  Jamie [name] was informed that the remainder of the evaluation will be completed by another provider, this initial triage assessment does not replace that evaluation, and the importance of remaining in the ED until their evaluation is complete.     Beverley Leita LABOR, PA-C 12/30/24 1739

## 2025-01-02 ENCOUNTER — Other Ambulatory Visit: Payer: Self-pay | Admitting: Internal Medicine

## 2025-01-02 DIAGNOSIS — K760 Fatty (change of) liver, not elsewhere classified: Secondary | ICD-10-CM

## 2025-01-23 ENCOUNTER — Other Ambulatory Visit

## 2025-01-29 ENCOUNTER — Other Ambulatory Visit
